# Patient Record
Sex: Female | Born: 1985 | Race: Black or African American | Hispanic: No | Marital: Married | Smoking: Never smoker
Health system: Southern US, Community
[De-identification: ages and names within clinical notes are randomized; demographics above are authoritative.]

## PROBLEM LIST (undated history)

## (undated) DIAGNOSIS — J45909 Unspecified asthma, uncomplicated: Secondary | ICD-10-CM

## (undated) DIAGNOSIS — C539 Malignant neoplasm of cervix uteri, unspecified: Secondary | ICD-10-CM

## (undated) DIAGNOSIS — G43909 Migraine, unspecified, not intractable, without status migrainosus: Secondary | ICD-10-CM

## (undated) DIAGNOSIS — K589 Irritable bowel syndrome without diarrhea: Secondary | ICD-10-CM

## (undated) HISTORY — PX: OTHER SURGICAL HISTORY: SHX169

## (undated) HISTORY — PX: BREAST LUMPECTOMY: SHX2

## (undated) HISTORY — PX: TUBAL LIGATION: SHX77

## (undated) HISTORY — PX: ABDOMINAL HYSTERECTOMY: SHX81

## (undated) HISTORY — PX: CHOLECYSTECTOMY: SHX55

## (undated) HISTORY — DX: Irritable bowel syndrome, unspecified: K58.9

## (undated) HISTORY — PX: KNEE SURGERY: SHX244

## (undated) HISTORY — PX: ROTATOR CUFF REPAIR: SHX139

---

## 2012-09-21 ENCOUNTER — Emergency Department (HOSPITAL_COMMUNITY)
Admission: EM | Admit: 2012-09-21 | Discharge: 2012-09-22 | Disposition: A | Discharge status: Home / Self Care | Payer: Commercial Managed Care - PPO | Attending: Emergency Medicine | Admitting: Emergency Medicine

## 2012-09-21 ENCOUNTER — Emergency Department (HOSPITAL_COMMUNITY): Payer: Commercial Managed Care - PPO

## 2012-09-21 ENCOUNTER — Encounter (HOSPITAL_COMMUNITY): Payer: Self-pay | Admitting: Emergency Medicine

## 2012-09-21 DIAGNOSIS — S139XXA Sprain of joints and ligaments of unspecified parts of neck, initial encounter: Secondary | ICD-10-CM | POA: Insufficient documentation

## 2012-09-21 DIAGNOSIS — S39012A Strain of muscle, fascia and tendon of lower back, initial encounter: Secondary | ICD-10-CM

## 2012-09-21 DIAGNOSIS — Z23 Encounter for immunization: Secondary | ICD-10-CM | POA: Insufficient documentation

## 2012-09-21 DIAGNOSIS — Y9389 Activity, other specified: Secondary | ICD-10-CM | POA: Insufficient documentation

## 2012-09-21 DIAGNOSIS — S161XXA Strain of muscle, fascia and tendon at neck level, initial encounter: Secondary | ICD-10-CM

## 2012-09-21 DIAGNOSIS — S335XXA Sprain of ligaments of lumbar spine, initial encounter: Secondary | ICD-10-CM | POA: Insufficient documentation

## 2012-09-21 DIAGNOSIS — E119 Type 2 diabetes mellitus without complications: Secondary | ICD-10-CM | POA: Insufficient documentation

## 2012-09-21 DIAGNOSIS — J45909 Unspecified asthma, uncomplicated: Secondary | ICD-10-CM | POA: Insufficient documentation

## 2012-09-21 DIAGNOSIS — G43909 Migraine, unspecified, not intractable, without status migrainosus: Secondary | ICD-10-CM | POA: Insufficient documentation

## 2012-09-21 DIAGNOSIS — Z79899 Other long term (current) drug therapy: Secondary | ICD-10-CM | POA: Insufficient documentation

## 2012-09-21 HISTORY — DX: Unspecified asthma, uncomplicated: J45.909

## 2012-09-21 LAB — GLUCOSE, CAPILLARY: Glucose-Capillary: 81 mg/dL (ref 70–99)

## 2012-09-21 MED ORDER — METHOCARBAMOL 750 MG PO TABS: 750.0000 mg | ORAL_TABLET | Freq: Four times a day (QID) | ORAL | Status: DC | PRN

## 2012-09-21 MED ORDER — HYDROCODONE-ACETAMINOPHEN 5-325 MG PO TABS
2.0000 | ORAL_TABLET | Freq: Once | ORAL | Status: AC
  Administered 2012-09-21: 2 via ORAL
  Filled 2012-09-21: qty 2

## 2012-09-21 MED ORDER — DIAZEPAM 5 MG PO TABS
10.0000 mg | ORAL_TABLET | Freq: Once | ORAL | Status: AC
  Administered 2012-09-21: 10 mg via ORAL
  Filled 2012-09-21: qty 2

## 2012-09-21 MED ORDER — HYDROCODONE-ACETAMINOPHEN 5-325 MG PO TABS: 1.0000 | ORAL_TABLET | Freq: Four times a day (QID) | ORAL | Status: DC | PRN

## 2012-09-21 MED ORDER — NAPROXEN 500 MG PO TABS: 500.0000 mg | ORAL_TABLET | Freq: Two times a day (BID) | ORAL | Status: DC | PRN

## 2012-09-21 MED ORDER — TETANUS-DIPHTH-ACELL PERTUSSIS 5-2.5-18.5 LF-MCG/0.5 IM SUSP
0.5000 mL | Freq: Once | INTRAMUSCULAR | Status: AC
  Administered 2012-09-21: 0.5 mL via INTRAMUSCULAR
  Filled 2012-09-21: qty 0.5
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
## 2012-09-21 NOTE — ED Notes (Signed)
CBG 81.  

## 2012-09-21 NOTE — ED Provider Notes (Signed)
Medical screening examination/treatment/procedure(s) were performed by non-physician practitioner and as supervising physician I was immediately available for consultation/collaboration.   Charles B. Bernette Mayers, MD 09/21/12 2141

## 2013-01-30 ENCOUNTER — Ambulatory Visit: Payer: Commercial Managed Care - PPO | Attending: Sports Medicine | Admitting: Physical Therapy

## 2013-01-30 DIAGNOSIS — M25519 Pain in unspecified shoulder: Secondary | ICD-10-CM | POA: Insufficient documentation

## 2013-01-30 DIAGNOSIS — M25619 Stiffness of unspecified shoulder, not elsewhere classified: Secondary | ICD-10-CM | POA: Insufficient documentation

## 2013-01-30 DIAGNOSIS — IMO0001 Reserved for inherently not codable concepts without codable children: Secondary | ICD-10-CM | POA: Insufficient documentation

## 2013-02-04 ENCOUNTER — Ambulatory Visit: Payer: Commercial Managed Care - PPO | Admitting: Physical Therapy

## 2013-02-05 ENCOUNTER — Ambulatory Visit: Payer: Commercial Managed Care - PPO | Admitting: Physical Therapy

## 2013-02-06 ENCOUNTER — Ambulatory Visit: Payer: Commercial Managed Care - PPO | Admitting: Physical Therapy

## 2013-02-07 ENCOUNTER — Ambulatory Visit: Payer: Commercial Managed Care - PPO | Attending: Sports Medicine | Admitting: Physical Therapy

## 2013-02-07 DIAGNOSIS — IMO0001 Reserved for inherently not codable concepts without codable children: Secondary | ICD-10-CM | POA: Insufficient documentation

## 2013-02-07 DIAGNOSIS — M25619 Stiffness of unspecified shoulder, not elsewhere classified: Secondary | ICD-10-CM | POA: Insufficient documentation

## 2013-02-07 DIAGNOSIS — M25519 Pain in unspecified shoulder: Secondary | ICD-10-CM | POA: Insufficient documentation

## 2013-02-10 ENCOUNTER — Encounter (HOSPITAL_COMMUNITY): Payer: Self-pay | Admitting: Nurse Practitioner

## 2013-02-10 ENCOUNTER — Emergency Department (HOSPITAL_COMMUNITY): Payer: Commercial Managed Care - PPO

## 2013-02-10 ENCOUNTER — Emergency Department (HOSPITAL_COMMUNITY)
Admission: EM | Admit: 2013-02-10 | Discharge: 2013-02-10 | Disposition: A | Discharge status: Home / Self Care | Payer: Commercial Managed Care - PPO | Attending: Emergency Medicine | Admitting: Emergency Medicine

## 2013-02-10 DIAGNOSIS — Y929 Unspecified place or not applicable: Secondary | ICD-10-CM | POA: Insufficient documentation

## 2013-02-10 DIAGNOSIS — W230XXA Caught, crushed, jammed, or pinched between moving objects, initial encounter: Secondary | ICD-10-CM | POA: Insufficient documentation

## 2013-02-10 DIAGNOSIS — Z79899 Other long term (current) drug therapy: Secondary | ICD-10-CM | POA: Insufficient documentation

## 2013-02-10 DIAGNOSIS — J45909 Unspecified asthma, uncomplicated: Secondary | ICD-10-CM | POA: Insufficient documentation

## 2013-02-10 DIAGNOSIS — Y939 Activity, unspecified: Secondary | ICD-10-CM | POA: Insufficient documentation

## 2013-02-10 DIAGNOSIS — S92919A Unspecified fracture of unspecified toe(s), initial encounter for closed fracture: Secondary | ICD-10-CM | POA: Insufficient documentation

## 2013-02-10 DIAGNOSIS — S92911A Unspecified fracture of right toe(s), initial encounter for closed fracture: Secondary | ICD-10-CM

## 2013-02-10 DIAGNOSIS — E119 Type 2 diabetes mellitus without complications: Secondary | ICD-10-CM | POA: Insufficient documentation

## 2013-02-10 MED ORDER — HYDROCODONE-ACETAMINOPHEN 5-325 MG PO TABS
1.0000 | ORAL_TABLET | Freq: Once | ORAL | Status: AC
  Administered 2013-02-10: 1 via ORAL
  Filled 2013-02-10: qty 1

## 2013-02-10 MED ORDER — HYDROCODONE-ACETAMINOPHEN 5-325 MG PO TABS: ORAL_TABLET | ORAL | Status: DC

## 2013-02-10 NOTE — ED Provider Notes (Signed)
Medical screening examination/treatment/procedure(s) were performed by non-physician practitioner and as supervising physician I was immediately available for consultation/collaboration.    Shann Merrick R Payne Garske, MD 02/10/13 1553 

## 2013-02-10 NOTE — ED Notes (Signed)
Patient transported to X-ray 

## 2013-02-10 NOTE — Progress Notes (Signed)
Orthopedic Tech Progress Note Patient Details:  Alyssa Owen 08/05/1986 [id number]  Ortho Devices Type of Ortho Device: Buddy tape;Crutches;Postop shoe/boot Ortho Device/Splint Location: RLE Ortho Device/Splint Interventions: Ordered;Application;Adjustment   [name] 02/10/2013, 3:17 PM

## 2013-02-10 NOTE — ED Provider Notes (Signed)
History     CSN: [id number]  Arrival date & time 02/10/13  1347   First MD Initiated Contact with Patient 02/10/13 1410      Chief Complaint  Patient presents with  . Foot Pain    (Consider location/radiation/quality/duration/timing/severity/associated sxs/prior treatment) HPI  Alyssa Owen is a 27 y.o. female complaining of pain to right fifth digit after she jammed it on her husband shoe yesterday. Pain is rated at 8 out of 10, and is exacerbated by weightbearing and palpation. She is taken Motrin with little relief. Pain woke her from sleep at night. No prior injury to affected digit.  Past Medical History  Diagnosis Date  . Diabetes mellitus without complication   . Asthma     Past Surgical History  Procedure Laterality Date  . Cholecystectomy    . Rotator cuff repair      History reviewed. No pertinent family history.  History  Substance Use Topics  . Smoking status: Never Smoker   . Smokeless tobacco: Not on file  . Alcohol Use: No    OB History   Grav Para Term Preterm Abortions TAB SAB Ect Mult Living                  Review of Systems  Constitutional: Negative for fever.  Respiratory: Negative for shortness of breath.   Cardiovascular: Negative for chest pain.  Gastrointestinal: Negative for nausea, vomiting, abdominal pain and diarrhea.  All other systems reviewed and are negative.    Allergies  Codeine and Cephalosporins  Home Medications   Current Outpatient Rx  Name  Route  Sig  Dispense  Refill  . albuterol (PROVENTIL HFA;VENTOLIN HFA) 108 (90 BASE) MCG/ACT inhaler   Inhalation   Inhale 2 puffs into the lungs every 6 (six) hours as needed for wheezing.         Marland Kitchen amitriptyline (ELAVIL) 50 MG tablet   Oral   Take 50 mg by mouth at bedtime.         . Ergocalciferol (VITAMIN D2) 2000 UNITS TABS   Oral   Take 1 tablet by mouth daily.         . fluticasone-salmeterol (ADVAIR HFA) 115-21 MCG/ACT inhaler   Inhalation   Inhale 2  puffs into the lungs 2 (two) times daily.         Marland Kitchen ibuprofen (ADVIL,MOTRIN) 200 MG tablet   Oral   Take 800 mg by mouth every 6 (six) hours as needed for pain.         Marland Kitchen topiramate (TOPAMAX) 50 MG tablet   Oral   Take 50 mg by mouth daily.           BP 123/67  Pulse 74  Temp(Src) 98.3 F (36.8 C)  Resp 18  SpO2 100%  Physical Exam  Nursing note and vitals reviewed. Constitutional: She is oriented to person, place, and time. She appears well-developed and well-nourished. No distress.  HENT:  Head: Normocephalic.  Mouth/Throat: Oropharynx is clear and moist.  Eyes: Conjunctivae and EOM are normal.  Cardiovascular: Normal rate.   Pulmonary/Chest: Effort normal. No stridor.  Musculoskeletal: Normal range of motion.  Right 5th digit swelling and tenderness to palpation, neurovascularly intact reduced range of motion to  Neurological: She is alert and oriented to person, place, and time.  Psychiatric: She has a normal mood and affect.    ED Course  Procedures (including critical care time)  Labs Reviewed - No data to display Dg Toe 5th Right  02/10/2013  *RADIOLOGY REPORT*  Clinical Data: Fifth toe injury, pain  RIGHT FIFTH TOE  Comparison: None.  Findings: Nondisplaced acute fracture of the right fifth toe proximal phalanx.  Mild soft tissue swelling.  No associated subluxation or dislocation.  No radiopaque foreign body.  IMPRESSION: Nondisplaced fracture right fifth toe proximal phalanx.   Original Report Authenticated By: Judie Petit. Shick, M.D.      1. Phalanx fracture, foot, right, closed, initial encounter       MDM   Alyssa Owen is a 27 y.o. female nondisplaced right phalanx fracture. Neurovascularly intact Buddy tape, postop boot, crutches and podiatry referral.   Filed Vitals:   02/10/13 1356  BP: 123/67  Pulse: 74  Temp: 98.3 F (36.8 C)  Resp: 18  SpO2: 100%     VSS and patient is appropriate for, and amenable to, discharge at this time. Pt  verbalized understanding and agrees with care plan. Outpatient follow-up and return precautions given.    New Prescriptions   HYDROCODONE-ACETAMINOPHEN (NORCO/VICODIN) 5-325 MG PER TABLET    Take 1-2 tablets by mouth every 6 hours as needed for pain.            Wynetta Emery, PA-C 02/10/13 1507

## 2013-02-10 NOTE — ED Notes (Signed)
Pt jammed her R pinky toe yesterday. Pain since

## 2013-02-12 ENCOUNTER — Ambulatory Visit: Payer: Commercial Managed Care - PPO | Admitting: Physical Therapy

## 2013-02-15 ENCOUNTER — Ambulatory Visit: Payer: Commercial Managed Care - PPO | Admitting: Physical Therapy

## 2013-02-19 ENCOUNTER — Ambulatory Visit: Payer: Commercial Managed Care - PPO | Admitting: Physical Therapy

## 2013-02-21 ENCOUNTER — Ambulatory Visit: Payer: Commercial Managed Care - PPO | Admitting: Physical Therapy

## 2013-02-26 ENCOUNTER — Ambulatory Visit: Payer: Commercial Managed Care - PPO | Admitting: Physical Therapy

## 2013-02-28 ENCOUNTER — Ambulatory Visit: Payer: Commercial Managed Care - PPO | Admitting: Physical Therapy

## 2013-06-28 ENCOUNTER — Encounter (HOSPITAL_COMMUNITY): Payer: Self-pay

## 2013-06-28 ENCOUNTER — Emergency Department (HOSPITAL_COMMUNITY)
Admission: EM | Admit: 2013-06-28 | Discharge: 2013-06-28 | Discharge status: Against Medical Advice | Payer: Commercial Managed Care - PPO | Attending: Emergency Medicine | Admitting: Emergency Medicine

## 2013-06-28 DIAGNOSIS — R51 Headache: Secondary | ICD-10-CM | POA: Insufficient documentation

## 2013-06-28 DIAGNOSIS — J45909 Unspecified asthma, uncomplicated: Secondary | ICD-10-CM | POA: Insufficient documentation

## 2013-06-28 DIAGNOSIS — R509 Fever, unspecified: Secondary | ICD-10-CM | POA: Insufficient documentation

## 2013-06-28 DIAGNOSIS — R52 Pain, unspecified: Secondary | ICD-10-CM | POA: Insufficient documentation

## 2013-06-28 HISTORY — DX: Migraine, unspecified, not intractable, without status migrainosus: G43.909

## 2013-06-28 LAB — COMPREHENSIVE METABOLIC PANEL
ALT: 12 U/L (ref 0–35)
AST: 20 U/L (ref 0–37)
Albumin: 4 g/dL (ref 3.5–5.2)
Alkaline Phosphatase: 63 U/L (ref 39–117)
CO2: 28 mEq/L (ref 19–32)
Chloride: 105 mEq/L (ref 96–112)
Creatinine, Ser: 0.7 mg/dL (ref 0.50–1.10)
GFR calc non Af Amer: >90 mL/min (ref >90)
Potassium: 3.8 mEq/L (ref 3.5–5.1)
Sodium: 141 mEq/L (ref 135–145)
Total Bilirubin: 0.5 mg/dL (ref 0.3–1.2)

## 2013-06-28 LAB — CBC WITH DIFFERENTIAL/PLATELET
Basophils Absolute: 0 10*3/uL (ref 0.0–0.1)
Basophils Relative: 0 % (ref 0–1)
HCT: 34.3 % — ABNORMAL LOW (ref 36.0–46.0)
Lymphocytes Relative: 39 % (ref 12–46)
MCHC: 36.7 g/dL — ABNORMAL HIGH (ref 30.0–36.0)
Monocytes Absolute: 0.5 10*3/uL (ref 0.1–1.0)
Neutro Abs: 4.6 10*3/uL (ref 1.7–7.7)
Neutrophils Relative %: 54 % (ref 43–77)
RDW: 13.4 % (ref 11.5–15.5)
WBC: 8.6 10*3/uL (ref 4.0–10.5)

## 2013-06-28 NOTE — ED Notes (Addendum)
Pt c/o headache and sensitivity to light x3 days, neck stiffness, unable to fully bend her chin to her chest, fever starting today, and all over body aches x1-2 weeks, increase pain in her joints. [place] bites. Pt report staking Ibuprofen 800 mg at 1800 this pm.

## 2013-06-28 NOTE — ED Notes (Addendum)
Pt told registration that she was leaving.  Stated that she did not want to get any sicker due to germs from other patients and that she would go to [facility] in the morning.  RN was with another pt and pt did not speak with RN

## 2013-10-25 ENCOUNTER — Ambulatory Visit
Admission: RE | Admit: 2013-10-25 | Discharge: 2013-10-25 | Disposition: A | Discharge status: Home / Self Care | Payer: No Typology Code available for payment source | Source: Ambulatory Visit | Attending: Allergy and Immunology | Admitting: Allergy and Immunology

## 2013-10-25 ENCOUNTER — Other Ambulatory Visit: Payer: Self-pay | Admitting: Allergy and Immunology

## 2013-10-25 DIAGNOSIS — R7611 Nonspecific reaction to tuberculin skin test without active tuberculosis: Secondary | ICD-10-CM

## 2013-10-25 DIAGNOSIS — R05 Cough: Secondary | ICD-10-CM

## 2013-10-25 DIAGNOSIS — R059 Cough, unspecified: Secondary | ICD-10-CM

## 2013-10-29 ENCOUNTER — Emergency Department (HOSPITAL_COMMUNITY)
Admission: EM | Admit: 2013-10-29 | Discharge: 2013-10-29 | Disposition: A | Discharge status: Home / Self Care | Payer: BC Managed Care – PPO | Attending: Emergency Medicine | Admitting: Emergency Medicine

## 2013-10-29 ENCOUNTER — Encounter (HOSPITAL_COMMUNITY): Payer: Self-pay | Admitting: Emergency Medicine

## 2013-10-29 ENCOUNTER — Emergency Department (HOSPITAL_COMMUNITY): Payer: BC Managed Care – PPO

## 2013-10-29 DIAGNOSIS — W010XXA Fall on same level from slipping, tripping and stumbling without subsequent striking against object, initial encounter: Secondary | ICD-10-CM | POA: Insufficient documentation

## 2013-10-29 DIAGNOSIS — J45909 Unspecified asthma, uncomplicated: Secondary | ICD-10-CM | POA: Insufficient documentation

## 2013-10-29 DIAGNOSIS — IMO0002 Reserved for concepts with insufficient information to code with codable children: Secondary | ICD-10-CM | POA: Insufficient documentation

## 2013-10-29 DIAGNOSIS — W19XXXA Unspecified fall, initial encounter: Secondary | ICD-10-CM

## 2013-10-29 DIAGNOSIS — S61411A Laceration without foreign body of right hand, initial encounter: Secondary | ICD-10-CM

## 2013-10-29 DIAGNOSIS — Y939 Activity, unspecified: Secondary | ICD-10-CM | POA: Insufficient documentation

## 2013-10-29 DIAGNOSIS — Y929 Unspecified place or not applicable: Secondary | ICD-10-CM | POA: Insufficient documentation

## 2013-10-29 DIAGNOSIS — G43909 Migraine, unspecified, not intractable, without status migrainosus: Secondary | ICD-10-CM | POA: Insufficient documentation

## 2013-10-29 DIAGNOSIS — S61409A Unspecified open wound of unspecified hand, initial encounter: Secondary | ICD-10-CM | POA: Insufficient documentation

## 2013-10-29 DIAGNOSIS — W268XXA Contact with other sharp object(s), not elsewhere classified, initial encounter: Secondary | ICD-10-CM | POA: Insufficient documentation

## 2013-10-29 MED ORDER — OXYCODONE-ACETAMINOPHEN 5-325 MG PO TABS
1.0000 | ORAL_TABLET | Freq: Once | ORAL | Status: AC
  Administered 2013-10-29: 1 via ORAL
  Filled 2013-10-29: qty 1

## 2013-10-29 MED ORDER — TRAMADOL HCL 50 MG PO TABS
50.0000 mg | ORAL_TABLET | Freq: Once | ORAL | Status: AC
  Administered 2013-10-29: 50 mg via ORAL
  Filled 2013-10-29: qty 1

## 2013-10-29 NOTE — ED Notes (Signed)
Pt states she tripped and fell over her purse and her right hand went through her glass door.

## 2013-10-29 NOTE — Discharge Instructions (Signed)
Take ibuprofen as needed for pain. Refer to attached documents for more information. Return to the ED or your PCP for suture removal in 10 days.

## 2013-10-29 NOTE — ED Notes (Signed)
Kaitlyn, PA at the bedside.  

## 2013-10-29 NOTE — ED Provider Notes (Signed)
CSN: [id number]     Arrival date & time 10/29/13  0807 History   First MD Initiated Contact with Patient 10/29/13 [phone]     Chief Complaint  Patient presents with  . Extremity Laceration   (Consider location/radiation/quality/duration/timing/severity/associated sxs/prior Treatment) HPI Comments: Patient is a 28 year old female who presents with right hand pain that started this morning with her right hand went through a glass door. Patient reports she tripped over her purse and fell. Patient reports deep lacerations to her hand and copious bleeding. Patient denies any other injury. Patient is up to date on tetanus shot. Patient bandaged the wound. No other associated symptoms. No aggravating/alleviating factors.    Past Medical History  Diagnosis Date  . Asthma   . Migraine    Past Surgical History  Procedure Laterality Date  . Cholecystectomy    . Rotator cuff repair    . Uterine ablation    . Tubal ligation     No family history on file. History  Substance Use Topics  . Smoking status: Never Smoker   . Smokeless tobacco: Not on file  . Alcohol Use: No   OB History   Grav Para Term Preterm Abortions TAB SAB Ect Mult Living                 Review of Systems  Constitutional: Negative for fever, chills and fatigue.  HENT: Negative for trouble swallowing.   Eyes: Negative for visual disturbance.  Respiratory: Negative for shortness of breath.   Cardiovascular: Negative for chest pain and palpitations.  Gastrointestinal: Negative for nausea, vomiting, abdominal pain and diarrhea.  Genitourinary: Negative for dysuria and difficulty urinating.  Musculoskeletal: Negative for arthralgias and neck pain.  Skin: Positive for wound. Negative for color change.  Neurological: Negative for dizziness and weakness.  Psychiatric/Behavioral: Negative for dysphoric mood.    Allergies  Codeine; Oxycodone; and Cephalosporins  Home Medications   Current Outpatient Rx  Name  Route  Sig   Dispense  Refill  . ibuprofen (ADVIL,MOTRIN) 200 MG tablet   Oral   Take 800 mg by mouth every 6 (six) hours as needed for pain.         . mometasone-formoterol (DULERA) 200-5 MCG/ACT AERO   Inhalation   Inhale 2 puffs into the lungs 2 (two) times daily.         . rizatriptan (MAXALT) 5 MG tablet   Oral   Take 5 mg by mouth daily as needed for migraine (for migraines). May repeat in 2 hours if needed          BP 121/85  Pulse 97  Temp(Src) 98.3 F (36.8 C) (Oral)  Resp 20  Ht 5\' 9"  (1.753 m)  Wt 254 lb (115.214 kg)  BMI 37.49 kg/m2  SpO2 99% Physical Exam  Nursing note and vitals reviewed. Constitutional: She is oriented to person, place, and time. She appears well-developed and well-nourished. No distress.  HENT:  Head: Normocephalic and atraumatic.  Eyes: Conjunctivae are normal.  Neck: Normal range of motion.  Cardiovascular: Normal rate and regular rhythm.  Exam reveals no gallop and no friction rub.   No murmur heard. Pulmonary/Chest: Effort normal and breath sounds normal. She has no wheezes. She has no rales. She exhibits no tenderness.  Musculoskeletal: Normal range of motion.  Neurological: She is alert and oriented to person, place, and time. Coordination normal.  Speech is goal-oriented. Moves limbs without ataxia.   Skin: Skin is warm and dry.  Right hand: 2 separate 3 cm lacerations of dorsal hand that are bleeding currently. Scattered small puncture wounds over right hand. 2 separate 2 cm superficial lacerations of volar wrist.  Psychiatric: She has a normal mood and affect. Her behavior is normal.    ED Course  Procedures (including critical care time)  LACERATION REPAIR Performed by: Alpha GulaJosh Storm   Authorized by: Emilia BeckKaitlyn Tanai Bouler Consent: Verbal consent obtained. Risks and benefits: risks, benefits and alternatives were discussed Consent given by: patient Patient identity confirmed: provided demographic data Prepped and Draped in normal  sterile fashion Wound explored  Laceration Location: right dorsal hand  Laceration Length: 2 separate 3 cm lacerations  No Foreign Bodies seen or palpated  Anesthesia: local infiltration  Local anesthetic: lidocaine 2% with epinephrine  Anesthetic total: 5 ml  Irrigation method: syringe Amount of cleaning: standard  Skin closure: 4-0 prolene  Number of sutures: 9  Technique: simple  Patient tolerance: Patient tolerated the procedure well with no immediate complications.   Labs Review Labs Reviewed - No data to display Imaging Review Dg Hand Complete Right  10/29/2013   CLINICAL DATA:  Extremity laceration.  Fell through glass door.  EXAM: RIGHT HAND - COMPLETE 3+ VIEW  COMPARISON:  None.  FINDINGS: Soft tissue laceration is visible dorsal to the fifth metacarpal. There is overlying bandage material. No acute fracture, dislocation, focal bony abnormality, or unexpected radiopaque foreign body is identified.  IMPRESSION: Soft tissue laceration the hand. No acute bony abnormality or unexpected radiopaque foreign body identified.   Electronically Signed   By: Britta MccreedySusan  Turner M.D.   On: 10/29/2013 08:46    EKG Interpretation   None       MDM   1. Fall   2. Laceration of right hand     9:46 AM Hand xray unremarkable for acute changes. Patient will have laceration thoroughly cleaned and repaired. Patient is up to date on tetanus. Vitals stable and patient afebrile. Patient denies any other injury.   10:20 AM Wound repaired without complication. Patient will have ibuprofen for pain at home. Patient instructed to return here or to her PCP in 10 days for suture removal. No neurovascular compromise noted.    Emilia BeckKaitlyn Ramiah Helfrich, PA-C 10/29/13 1021

## 2013-10-30 NOTE — ED Provider Notes (Signed)
Medical screening examination/treatment/procedure(s) were performed by non-physician practitioner and as supervising physician I was immediately available for consultation/collaboration.  EKG Interpretation   None         Errin Chewning E Chandlar Guice, MD 10/30/13 0923 

## 2014-03-09 ENCOUNTER — Emergency Department (HOSPITAL_COMMUNITY)
Admission: EM | Admit: 2014-03-09 | Discharge: 2014-03-09 | Disposition: A | Discharge status: Home / Self Care | Payer: No Typology Code available for payment source | Source: Home / Self Care | Attending: Family Medicine | Admitting: Family Medicine

## 2014-03-09 ENCOUNTER — Encounter (HOSPITAL_COMMUNITY): Payer: Self-pay | Admitting: Emergency Medicine

## 2014-03-09 DIAGNOSIS — G43901 Migraine, unspecified, not intractable, with status migrainosus: Secondary | ICD-10-CM

## 2014-03-09 MED ORDER — KETOROLAC TROMETHAMINE 60 MG/2ML IM SOLN
60.0000 mg | Freq: Once | INTRAMUSCULAR | Status: AC
  Administered 2014-03-09: 60 mg via INTRAMUSCULAR

## 2014-03-09 MED ORDER — DIPHENHYDRAMINE HCL 25 MG PO CAPS
25.0000 mg | ORAL_CAPSULE | Freq: Once | ORAL | Status: AC
  Administered 2014-03-09: 25 mg via ORAL

## 2014-03-09 MED ORDER — METOCLOPRAMIDE HCL 5 MG/ML IJ SOLN
5.0000 mg | Freq: Once | INTRAMUSCULAR | Status: AC
  Administered 2014-03-09: 5 mg via INTRAMUSCULAR

## 2014-03-09 MED ORDER — METOCLOPRAMIDE HCL 5 MG/ML IJ SOLN
INTRAMUSCULAR | Status: AC
  Filled 2014-03-09: qty 2

## 2014-03-09 MED ORDER — DICLOFENAC SODIUM 50 MG PO TBEC: 50.0000 mg | DELAYED_RELEASE_TABLET | Freq: Two times a day (BID) | ORAL | Status: DC | PRN

## 2014-03-09 MED ORDER — DEXAMETHASONE SODIUM PHOSPHATE 10 MG/ML IJ SOLN
10.0000 mg | Freq: Once | INTRAMUSCULAR | Status: AC
Start: 2014-03-09 — End: 2014-03-09
  Administered 2014-03-09: 10 mg via INTRAMUSCULAR

## 2014-03-09 MED ORDER — DEXAMETHASONE SODIUM PHOSPHATE 10 MG/ML IJ SOLN
INTRAMUSCULAR | Status: AC
  Filled 2014-03-09: qty 1

## 2014-03-09 MED ORDER — DIPHENHYDRAMINE HCL 25 MG PO CAPS
ORAL_CAPSULE | ORAL | Status: AC
  Filled 2014-03-09: qty 1

## 2014-03-09 MED ORDER — KETOROLAC TROMETHAMINE 60 MG/2ML IM SOLN
INTRAMUSCULAR | Status: AC
  Filled 2014-03-09: qty 2

## 2014-03-09 NOTE — ED Notes (Signed)
C/o migraine for four days  States she has tried Maxalt, ibuprofen, tylenol and goody's as tx

## 2014-03-09 NOTE — Discharge Instructions (Signed)
Thank you for coming in today. Go to the emergency room if your headache becomes excruciating or you have weakness or numbness or uncontrolled vomiting.    Recurrent Migraine Headache A migraine headache is an intense, throbbing pain on one or both sides of your head. Recurrent migraines keep coming back. A migraine can last for 30 minutes to several hours. CAUSES  The exact cause of a migraine headache is not always known. However, a migraine may be caused when nerves in the brain become irritated and release chemicals that cause inflammation. This causes pain. Certain things may also trigger migraines, such as:   Alcohol.  Smoking.  Stress.  Menstruation.  Aged cheeses.  Foods or drinks that contain nitrates, glutamate, aspartame, or tyramine.  Lack of sleep.  Chocolate.  Caffeine.  Hunger.  Physical exertion.  Fatigue.  Medicines used to treat chest pain (nitroglycerine), birth control pills, estrogen, and some blood pressure medicines. SYMPTOMS   Pain on one or both sides of your head.  Pulsating or throbbing pain.  Severe pain that prevents daily activities.  Pain that is aggravated by any physical activity.  Nausea, vomiting, or both.  Dizziness.  Pain with exposure to bright lights, loud noises, or activity.  General sensitivity to bright lights, loud noises, or smells. Before you get a migraine, you may get warning signs that a migraine is coming (aura). An aura may include:  Seeing flashing lights.  Seeing bright spots, halos, or zig-zag lines.  Having tunnel vision or blurred vision.  Having feelings of numbness or tingling.  Having trouble talking.  Having muscle weakness. DIAGNOSIS  A recurrent migraine headache is often diagnosed based on:  Symptoms.  Physical examination.  A CT scan or MRI of your head. These imaging tests cannot diagnose migraines, but can help rule out other causes of headaches.  TREATMENT  Medicines may be  given for pain and nausea. Medicines can also be given to help prevent recurrent migraines. HOME CARE INSTRUCTIONS  Only take over-the-counter or prescription medicines for pain or discomfort as directed by your health care provider. The use of long-term narcotics is not recommended.  Lie down in a dark, quiet room when you have a migraine.  Keep a journal to find out what may trigger your migraine headaches. For example, write down:  What you eat and drink.  How much sleep you get.  Any change to your diet or medicines.  Limit alcohol consumption.  Quit smoking if you smoke.  Get 7 9 hours of sleep, or as recommended by your health care provider.  Limit stress.  Keep lights dim if bright lights bother you and make your migraines worse. SEEK MEDICAL CARE IF:   You do not get relief from the medicines given to you.  You have a recurrence of pain. SEEK IMMEDIATE MEDICAL CARE IF:  Your migraine becomes severe.  You have a fever.  You have a stiff neck.  You have loss of vision.  You have muscular weakness or loss of muscle control.  You start losing your balance or have trouble walking.  You feel faint or pass out.  You have severe symptoms that are different from your first symptoms. MAKE SURE YOU:   Understand these instructions.  Will watch your condition.  Will get help right away if you are not doing well or get worse. Document Released: 06/21/2001 Document Revised: 07/17/2013 Document Reviewed: 06/03/2013 [facility] Patient Information 2014 [place], [place].

## 2014-03-09 NOTE — ED Provider Notes (Addendum)
Alyssa Owen is a 28 y.o. female who presents to Urgent Care today for migraine for 4 days. Patient has right-sided squeezing headache consistent with prior migraines the last 4 days. No fevers or chills nausea vomiting or diarrhea. Symptoms consistent with prior migraine. She's tried Maxalt Tylenol ibuprofen and get better which have not helped. She feels well otherwise. No weakness or numbness loss of function.   Past Medical History  Diagnosis Date  . Asthma   . Migraine    History  Substance Use Topics  . Smoking status: Never Smoker   . Smokeless tobacco: Not on file  . Alcohol Use: No   ROS as above Medications: No current facility-administered medications for this encounter.   Current Outpatient Prescriptions  Medication Sig Dispense Refill  . ibuprofen (ADVIL,MOTRIN) 200 MG tablet Take 800 mg by mouth every 6 (six) hours as needed for pain.      . mometasone-formoterol (DULERA) 200-5 MCG/ACT AERO Inhale 2 puffs into the lungs 2 (two) times daily.      . rizatriptan (MAXALT) 5 MG tablet Take 5 mg by mouth daily as needed for migraine (for migraines). May repeat in 2 hours if needed        Exam:  BP 104/56  Pulse 61  Temp(Src) 98.3 F (36.8 C) (Oral)  Resp 18  SpO2 100% Gen: Well NAD HEENT: EOMI,  MMM PERRLA. Lungs: Normal work of breathing. CTABL Heart: RRR no MRG Abd: NABS, Soft. NT, ND Exts: Brisk capillary refill, warm and well perfused.  Neuro: Alert and oriented cranial nerves intact normal coordination gait reflexes strength and sensation.  Patient was given intramuscular Toradol, dexamethasone, and Reglan. She developed itching. She denies any throat closing or trouble breathing. She was given 25 mg of oral Benadryl. After 20 minutes the itching had resolved. She has no respiratory complaints and her headache is somewhat improved.  No results found for this or any previous visit (from the past 24 hour(s)). No results found.  Assessment and Plan: 28 y.o.  female with migraine. Followup with PCP. Headache until as above. Diclofenac for home use as needed.  Discussed warning signs or symptoms. Please see discharge instructions. Patient expresses understanding.    Rodolph Bong, MD 03/09/14 1237  Addendum to include doses of medications provided  dexamethasone (DECADRON) injection 10 mg Intramuscular  ketorolac (TORADOL) injection 60 mg Intramuscular  metoCLOPramide (REGLAN) injection 5 mg    Rodolph Bong, MD 06/06/14 [phone]

## 2014-03-10 ENCOUNTER — Encounter (HOSPITAL_COMMUNITY): Payer: Self-pay | Admitting: Emergency Medicine

## 2014-03-10 ENCOUNTER — Emergency Department (HOSPITAL_COMMUNITY)
Admission: EM | Admit: 2014-03-10 | Discharge: 2014-03-10 | Disposition: A | Discharge status: Home / Self Care | Payer: No Typology Code available for payment source | Attending: Emergency Medicine | Admitting: Emergency Medicine

## 2014-03-10 DIAGNOSIS — J45909 Unspecified asthma, uncomplicated: Secondary | ICD-10-CM | POA: Insufficient documentation

## 2014-03-10 DIAGNOSIS — R209 Unspecified disturbances of skin sensation: Secondary | ICD-10-CM | POA: Insufficient documentation

## 2014-03-10 DIAGNOSIS — Z79899 Other long term (current) drug therapy: Secondary | ICD-10-CM | POA: Insufficient documentation

## 2014-03-10 DIAGNOSIS — R51 Headache: Secondary | ICD-10-CM

## 2014-03-10 DIAGNOSIS — IMO0002 Reserved for concepts with insufficient information to code with codable children: Secondary | ICD-10-CM | POA: Insufficient documentation

## 2014-03-10 DIAGNOSIS — R002 Palpitations: Secondary | ICD-10-CM | POA: Insufficient documentation

## 2014-03-10 DIAGNOSIS — R42 Dizziness and giddiness: Secondary | ICD-10-CM | POA: Insufficient documentation

## 2014-03-10 DIAGNOSIS — R519 Headache, unspecified: Secondary | ICD-10-CM

## 2014-03-10 DIAGNOSIS — G43909 Migraine, unspecified, not intractable, without status migrainosus: Secondary | ICD-10-CM | POA: Insufficient documentation

## 2014-03-10 DIAGNOSIS — Z3202 Encounter for pregnancy test, result negative: Secondary | ICD-10-CM | POA: Insufficient documentation

## 2014-03-10 DIAGNOSIS — R55 Syncope and collapse: Secondary | ICD-10-CM | POA: Insufficient documentation

## 2014-03-10 DIAGNOSIS — M62838 Other muscle spasm: Secondary | ICD-10-CM | POA: Insufficient documentation

## 2014-03-10 LAB — BASIC METABOLIC PANEL
BUN: 9 mg/dL (ref 6–23)
CHLORIDE: 106 meq/L (ref 96–112)
CO2: 24 mEq/L (ref 19–32)
Calcium: 9.1 mg/dL (ref 8.4–10.5)
Creatinine, Ser: 0.72 mg/dL (ref 0.50–1.10)
GFR calc non Af Amer: >90 mL/min (ref >90)
GLUCOSE: 105 mg/dL — AB (ref 70–99)
POTASSIUM: 3.8 meq/L (ref 3.7–5.3)
Sodium: 140 mEq/L (ref 137–147)

## 2014-03-10 LAB — URINALYSIS, ROUTINE W REFLEX MICROSCOPIC
Bilirubin Urine: NEGATIVE
GLUCOSE, UA: NEGATIVE mg/dL
Hgb urine dipstick: NEGATIVE
Ketones, ur: NEGATIVE mg/dL
Leukocytes, UA: NEGATIVE
Nitrite: NEGATIVE
Protein, ur: NEGATIVE mg/dL
SPECIFIC GRAVITY, URINE: 1.029 (ref 1.005–1.030)
UROBILINOGEN UA: 0.2 mg/dL (ref 0.0–1.0)
pH: 6 (ref 5.0–8.0)

## 2014-03-10 LAB — CBC
HEMATOCRIT: 34.2 % — AB (ref 36.0–46.0)
Hemoglobin: 12.2 g/dL (ref 12.0–15.0)
MCH: 30 pg (ref 26.0–34.0)
MCHC: 35.7 g/dL (ref 30.0–36.0)
MCV: 84.2 fL (ref 78.0–100.0)
Platelets: 212 10*3/uL (ref 150–400)
RBC: 4.06 MIL/uL (ref 3.87–5.11)
RDW: 13 % (ref 11.5–15.5)
WBC: 16.6 10*3/uL — AB (ref 4.0–10.5)

## 2014-03-10 LAB — PREGNANCY, URINE: Preg Test, Ur: NEGATIVE

## 2014-03-10 MED ORDER — METOCLOPRAMIDE HCL 5 MG/ML IJ SOLN
10.0000 mg | Freq: Once | INTRAMUSCULAR | Status: AC
  Administered 2014-03-10: 10 mg via INTRAVENOUS
  Filled 2014-03-10: qty 2

## 2014-03-10 MED ORDER — DIAZEPAM 5 MG PO TABS: 5.0000 mg | ORAL_TABLET | Freq: Two times a day (BID) | ORAL | Status: DC

## 2014-03-10 MED ORDER — DIPHENHYDRAMINE HCL 50 MG/ML IJ SOLN
25.0000 mg | Freq: Once | INTRAMUSCULAR | Status: AC
  Administered 2014-03-10: 25 mg via INTRAVENOUS
  Filled 2014-03-10: qty 1

## 2014-03-10 MED ORDER — KETOROLAC TROMETHAMINE 30 MG/ML IJ SOLN
30.0000 mg | Freq: Once | INTRAMUSCULAR | Status: AC
  Administered 2014-03-10: 30 mg via INTRAVENOUS
  Filled 2014-03-10: qty 1

## 2014-03-10 MED ORDER — LIDOCAINE 5 % EX PTCH: 1.0000 | MEDICATED_PATCH | CUTANEOUS | Status: DC

## 2014-03-10 MED ORDER — MORPHINE SULFATE 4 MG/ML IJ SOLN: 4.0000 mg | Freq: Once | INTRAMUSCULAR | Status: DC

## 2014-03-10 MED ORDER — PROCHLORPERAZINE EDISYLATE 5 MG/ML IJ SOLN
10.0000 mg | Freq: Once | INTRAMUSCULAR | Status: AC
  Administered 2014-03-10: 10 mg via INTRAVENOUS
  Filled 2014-03-10: qty 2
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
## 2014-03-10 NOTE — Discharge Instructions (Signed)
Please follow up with your primary care physician in 1-2 days. If you do not have one please call the [facility] number listed above. Please follow up at your scheduled neurology appointment. Please take pain medication and/or muscle relaxants as prescribed and as needed for pain. Please do not drive on narcotic pain medication or on muscle relaxants. Please read all discharge instructions and return precautions.   Migraine Headache A migraine headache is an intense, throbbing pain on one or both sides of your head. A migraine can last for 30 minutes to several hours. CAUSES  The exact cause of a migraine headache is not always known. However, a migraine may be caused when nerves in the brain become irritated and release chemicals that cause inflammation. This causes pain. Certain things may also trigger migraines, such as:  Alcohol.  Smoking.  Stress.  Menstruation.  Aged cheeses.  Foods or drinks that contain nitrates, glutamate, aspartame, or tyramine.  Lack of sleep.  Chocolate.  Caffeine.  Hunger.  Physical exertion.  Fatigue.  Medicines used to treat chest pain (nitroglycerine), birth control pills, estrogen, and some blood pressure medicines. SIGNS AND SYMPTOMS  Pain on one or both sides of your head.  Pulsating or throbbing pain.  Severe pain that prevents daily activities.  Pain that is aggravated by any physical activity.  Nausea, vomiting, or both.  Dizziness.  Pain with exposure to bright lights, loud noises, or activity.  General sensitivity to bright lights, loud noises, or smells. Before you get a migraine, you may get warning signs that a migraine is coming (aura). An aura may include:  Seeing flashing lights.  Seeing bright spots, halos, or zig-zag lines.  Having tunnel vision or blurred vision.  Having feelings of numbness or tingling.  Having trouble talking.  Having muscle weakness. DIAGNOSIS  A migraine headache  is often diagnosed based on:  Symptoms.  Physical exam.  A CT scan or MRI of your head. These imaging tests cannot diagnose migraines, but they can help rule out other causes of headaches. TREATMENT Medicines may be given for pain and nausea. Medicines can also be given to help prevent recurrent migraines.  HOME CARE INSTRUCTIONS  Only take over-the-counter or prescription medicines for pain or discomfort as directed by your health care provider. The use of long-term narcotics is not recommended.  Lie down in a dark, quiet room when you have a migraine.  Keep a journal to find out what may trigger your migraine headaches. For example, write down:  What you eat and drink.  How much sleep you get.  Any change to your diet or medicines.  Limit alcohol consumption.  Quit smoking if you smoke.  Get 7 9 hours of sleep, or as recommended by your health care provider.  Limit stress.  Keep lights dim if bright lights bother you and make your migraines worse. SEEK IMMEDIATE MEDICAL CARE IF:   Your migraine becomes severe.  You have a fever.  You have a stiff neck.  You have vision loss.  You have muscular weakness or loss of muscle control.  You start losing your balance or have trouble walking.  You feel faint or pass out.  You have severe symptoms that are different from your first symptoms. MAKE SURE YOU:   Understand these instructions.  Will watch your condition.  Will get help right away if you are not doing well or get worse. Document Released: 09/26/2005 Document Revised: 07/17/2013 Document Reviewed: 06/03/2013 ExitCare Patient  Information 2014 [place].   Muscle Cramps and Spasms Muscle cramps and spasms occur when a muscle or muscles tighten and you have no control over this tightening (involuntary muscle contraction). They are a common problem and can develop in any muscle. The most common place is in the calf muscles of the leg. Both muscle  cramps and muscle spasms are involuntary muscle contractions, but they also have differences:   Muscle cramps are sporadic and painful. They may last a few seconds to a quarter of an hour. Muscle cramps are often more forceful and last longer than muscle spasms.  Muscle spasms may or may not be painful. They may also last just a few seconds or much longer. CAUSES  It is uncommon for cramps or spasms to be due to a serious underlying problem. In many cases, the cause of cramps or spasms is unknown. Some common causes are:   Overexertion.   Overuse from repetitive motions (doing the same thing over and over).   Remaining in a certain position for a long period of time.   Improper preparation, form, or technique while performing a sport or activity.   Dehydration.   Injury.   Side effects of some medicines.   Abnormally low levels of the salts and ions in your blood (electrolytes), especially potassium and calcium. This could happen if you are taking water pills (diuretics) or you are pregnant.  Some underlying medical problems can make it more likely to develop cramps or spasms. These include, but are not limited to:   Diabetes.   Parkinson disease.   Hormone disorders, such as thyroid problems.   Alcohol abuse.   Diseases specific to muscles, joints, and bones.   Blood vessel disease where not enough blood is getting to the muscles.  HOME CARE INSTRUCTIONS   Stay well hydrated. Drink enough water and fluids to keep your urine clear or pale yellow.  It may be helpful to massage, stretch, and relax the affected muscle.  For tight or tense muscles, use a warm towel, heating pad, or hot shower water directed to the affected area.  If you are sore or have pain after a cramp or spasm, applying ice to the affected area may relieve discomfort.  Put ice in a plastic bag.  Place a towel between your skin and the bag.  Leave the ice on for 15-20 minutes, 03-04  times a day.  Medicines used to treat a known cause of cramps or spasms may help reduce their frequency or severity. Only take over-the-counter or prescription medicines as directed by your caregiver. SEEK MEDICAL CARE IF:  Your cramps or spasms get more severe, more frequent, or do not improve over time.  MAKE SURE YOU:   Understand these instructions.  Will watch your condition.  Will get help right away if you are not doing well or get worse. Document Released: 03/18/2002 Document Revised: 01/21/2013 Document Reviewed: 09/12/2012 [facility] Patient Information 2014 [place], [place]. Syncope Syncope is a fainting spell. This means the person loses consciousness and drops to the ground. The person is generally unconscious for less than 5 minutes. The person may have some muscle twitches for up to 15 seconds before waking up and returning to normal. Syncope occurs more often in elderly people, but it can happen to anyone. While most causes of syncope are not dangerous, syncope can be a sign of a serious medical problem. It is important to seek medical care.  CAUSES  Syncope is caused by a  sudden decrease in blood flow to the brain. The specific cause is often not determined. Factors that can trigger syncope include:  Taking medicines that lower blood pressure.  Sudden changes in posture, such as standing up suddenly.  Taking more medicine than prescribed.  Standing in one place for too long.  Seizure disorders.  Dehydration and excessive exposure to heat.  Low blood sugar (hypoglycemia).  Straining to have a bowel movement.  Heart disease, irregular heartbeat, or other circulatory problems.  Fear, emotional distress, seeing blood, or severe pain. SYMPTOMS  Right before fainting, you may:  Feel dizzy or lightheaded.  Feel nauseous.  See all white or all black in your field of vision.  Have cold, clammy skin. DIAGNOSIS  Your caregiver will ask about your symptoms, perform  a physical exam, and perform electrocardiography (ECG) to record the electrical activity of your heart. Your caregiver may also perform other heart or blood tests to determine the cause of your syncope. TREATMENT  In most cases, no treatment is needed. Depending on the cause of your syncope, your caregiver may recommend changing or stopping some of your medicines. HOME CARE INSTRUCTIONS  Have someone stay with you until you feel stable.  Do not drive, operate machinery, or play sports until your caregiver says it is okay.  Keep all follow-up appointments as directed by your caregiver.  Lie down right away if you start feeling like you might faint. Breathe deeply and steadily. Wait until all the symptoms have passed.  Drink enough fluids to keep your urine clear or pale yellow.  If you are taking blood pressure or heart medicine, get up slowly, taking several minutes to sit and then stand. This can reduce dizziness. SEEK IMMEDIATE MEDICAL CARE IF:   You have a severe headache.  You have unusual pain in the chest, abdomen, or back.  You are bleeding from the mouth or rectum, or you have black or tarry stool.  You have an irregular or very fast heartbeat.  You have pain with breathing.  You have repeated fainting or seizure-like jerking during an episode.  You faint when sitting or lying down.  You have confusion.  You have difficulty walking.  You have severe weakness.  You have vision problems. If you fainted, call your local emergency services (911 in [place] Do not drive yourself to the hospital.  MAKE SURE YOU:  Understand these instructions.  Will watch your condition.  Will get help right away if you are not doing well or get worse. Document Released: 09/26/2005 Document Revised: 03/27/2012 Document Reviewed: 11/25/2011 [facility] Patient Information 2014 [place], [place].

## 2014-03-12 ENCOUNTER — Ambulatory Visit (INDEPENDENT_AMBULATORY_CARE_PROVIDER_SITE_OTHER): Payer: No Typology Code available for payment source | Admitting: Neurology

## 2014-03-12 ENCOUNTER — Encounter: Payer: Self-pay | Admitting: Neurology

## 2014-03-12 ENCOUNTER — Telehealth: Payer: Self-pay | Admitting: *Deleted

## 2014-03-12 VITALS — BP 118/74 | HR 78 | Ht 69.0 in | Wt 255.0 lb

## 2014-03-12 DIAGNOSIS — G43909 Migraine, unspecified, not intractable, without status migrainosus: Secondary | ICD-10-CM

## 2014-03-12 DIAGNOSIS — J45909 Unspecified asthma, uncomplicated: Secondary | ICD-10-CM | POA: Insufficient documentation

## 2014-03-12 MED ORDER — TOPIRAMATE 100 MG PO TABS: 100.0000 mg | ORAL_TABLET | Freq: Two times a day (BID) | ORAL | Status: DC

## 2014-03-12 MED ORDER — RIZATRIPTAN BENZOATE 10 MG PO TBDP: 10.0000 mg | ORAL_TABLET | ORAL | Status: DC | PRN

## 2014-03-12 MED ORDER — METHYLPREDNISOLONE (PAK) 4 MG PO TABS: ORAL_TABLET | ORAL | Status: DC

## 2014-03-12 MED ORDER — NORTRIPTYLINE HCL 10 MG PO CAPS: ORAL_CAPSULE | ORAL | Status: DC

## 2014-03-12 MED ORDER — VALPROATE SODIUM 500 MG/5ML IV SOLN
1500.0000 mg | INTRAVENOUS | Status: DC
  Administered 2014-03-12: 1500 mg via INTRAVENOUS

## 2014-03-12 MED ORDER — KETOROLAC TROMETHAMINE 30 MG/ML IJ SOLN: 30.0000 mg | Freq: Once | INTRAMUSCULAR | Status: DC

## 2014-03-12 MED ORDER — KETOROLAC TROMETHAMINE 30 MG/ML IJ SOLN
30.0000 mg | Freq: Once | INTRAMUSCULAR | Status: AC
  Administered 2014-03-12: 30 mg via INTRAMUSCULAR

## 2014-03-12 NOTE — Progress Notes (Signed)
Patient here seeing Dr. Terrace Arabia for headache.  Order for Depacon 1000mg  IV may repeat 500mg , Solumedrol 250mg IV , Compazine 10mg  IV, Toradol 30mg  IM.  Patient to treatment room, headache pain level 10.  Patient very difficult stick.  IV started in right AC, good blood return, 24g angiocath.  Stick was painful due to previous IV on Monday at the hospital.  Patient very cold and had chills, covered with 3 blankets, temperature 97.9.  Depacon 1000mg /100cc NaCl started at 0922.  Upon completion no change in pain level.  Depacon 500mg /100cc NaCl started at 0939.  Upon completion slight change in pain level to 8.  Solumedrol 250mg /100cc NaCl started at 0955.  Patient complained of burning and soreness at IV site, site was red, swollen and painful.  Infusion was stopped and IV removed.  When patient stood up to use restroom, pain level went back up to 10. Spoke to doctor and order for Toradol 30mg  IM to be given but no Compazine, also no further IV started.  Doctor came and spoke to patient.  Under aseptic technique Toradol 30mg  IM given in left gluteal.  Patient to check out with spouse in NAD.

## 2014-03-12 NOTE — Telephone Encounter (Signed)
Left message that I would relay to Dr. Terrace Arabia that she had a temperature of 101.2 when she got home.  Also will write an out of work note for today, Thursday and Friday and call her tomorrow when note is ready.

## 2014-03-12 NOTE — Addendum Note (Signed)
Addended by: [name] on: 03/12/2014 10:32 AM   Modules accepted: Orders

## 2014-03-12 NOTE — Progress Notes (Addendum)
PATIENT: Alyssa ShellingJammie Owen DOB: 1985/12/16  HISTORICAL  Alyssa BoxJammie Manson PasseyBrown is a 28 years old right-handed African American female, referred by her primary care physician Dr. Jola SchmidtEric Beck for evaluation of migraine headaches  She has migraine since middle school, her typical migraine are right retro-orbital area severe pounding headaches, with associated light, noise sensitivity, she has tried amitriptyline 50 mg every night, Topamax 25 mg twice a day, which has worked very well for her, while she taking preventive medications, she only has one to 2 migraine each month, Maxalt has been very helpful, but she suffered a side effect of excessive drowsiness, hung over, decreased sex drive while taking amitriptyline 50 mg every night, there was no significant side effect from Topamax  She has run out her prescription for one year, began to have increased frequency of headaches over the past 6 months, getting worse over the past 3 weeks, her headache now lasting for one week, she presented to urgent care, and a [facility] on emergency room, twice over the past couple weeks, 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, has tried Excedrin Migraine, Maxalt, diclofenac, without helping her headaches, she was given IV Benadryl, Reglan, Compazine, Toradol, with only temporary relief of her symptoms,  She came in today with a one-week history of right retro-orbital area pounding headaches, worsening by movement, with associated light sensitivity, nausea, blurry vision, right arm numbness, but no weakness,   REVIEW OF SYSTEMS: Full 14 system review of systems performed and notable only for fatigue, chest pain, palpitation, blurry vision, eye pain, easy bruising, flushing, pain, achy muscles allergy, headaches, numbness, weakness, dizziness, passing out, sleepiness, decreased energy   ALLERGIES: Allergies  Allergen Reactions  . Codeine     Scratchy throat, cough, hives  . Oxycodone Other (See  Comments)    Hot flashes   . Cephalosporins Rash and Cough    HOME MEDICATIONS: Current Outpatient Prescriptions on File Prior to Visit  Medication Sig Dispense Refill  . albuterol (PROVENTIL HFA;VENTOLIN HFA) 108 (90 BASE) MCG/ACT inhaler Inhale 2 puffs into the lungs every 6 (six) hours as needed for wheezing or shortness of breath.      . diazepam (VALIUM) 5 MG tablet Take 1 tablet (5 mg total) by mouth 2 (two) times daily.  10 tablet  0  . diclofenac (VOLTAREN) 50 MG EC tablet Take 50 mg by mouth 2 (two) times daily as needed for moderate pain.      Marland Kitchen. ibuprofen (ADVIL,MOTRIN) 200 MG tablet Take 600-800 mg by mouth every 6 (six) hours as needed for headache or moderate pain.       Marland Kitchen. lidocaine (LIDODERM) 5 % Place 1 patch onto the skin daily. Remove & Discard patch within 12 hours or as directed by MD  10 patch  0  . mometasone-formoterol (DULERA) 200-5 MCG/ACT AERO Inhale 2 puffs into the lungs 2 (two) times daily.      . rizatriptan (MAXALT) 5 MG tablet Take 5 mg by mouth daily as needed for migraine.        No current facility-administered medications on file prior to visit.    PAST MEDICAL HISTORY: Past Medical History  Diagnosis Date  . Asthma   . Migraine     PAST SURGICAL HISTORY: Past Surgical History  Procedure Laterality Date  . Cholecystectomy    . Rotator cuff repair    . Uterine ablation    . Tubal ligation      FAMILY  HISTORY: Mother, sister, daughter has migraine  SOCIAL HISTORY:  History   Social History  . Marital Status: Married    Spouse Name: Michelle Piper    Number of Children: 3  . Years of Education: college   Occupational History  . CMA    Social History Main Topics  . Smoking status: Never Smoker   . Smokeless tobacco: Never Used  . Alcohol Use: Yes  . Drug Use: No  . Sexual Activity: No   Other Topics Concern  . Not on file   Social History Narrative   Patient lives at home with her husband and children. Patient works full time  Armed forces operational officer.    Education some college   Right handed.   Caffeine sometimes.    PHYSICAL EXAM   Filed Vitals:   03/12/14 0803  BP: 118/74  Pulse: 78  Height: 5\' 9"  (1.753 m)  Weight: 255 lb (115.667 kg)    Not recorded    Body mass index is 37.64 kg/(m^2).   Generalized: In no acute distress  Neck: Supple, no carotid bruits   Cardiac: Regular rate rhythm  Pulmonary: Clear to auscultation bilaterally  Musculoskeletal: No deformity  Neurological examination  Mentation: Alert oriented to time, place, history taking, and causual conversation  Cranial nerve II-XII: Pupils were equal round reactive to light. Extraocular movements were full.  Visual field were full on confrontational test. Bilateral fundi were sharp.  Facial sensation and strength were normal. Hearing was intact to finger rubbing bilaterally. Uvula tongue midline.  Head turning and shoulder shrug and were normal and symmetric.Tongue protrusion into cheek strength was normal.  Motor: Normal tone, bulk and strength.  Sensory: Intact to fine touch, pinprick, preserved vibratory sensation, and proprioception at toes.  Coordination: Normal finger to nose, heel-to-shin bilaterally there was no truncal ataxia  Gait: Rising up from seated position without assistance, normal stance, without trunk ataxia, moderate stride, good arm swing, smooth turning, able to perform tiptoe, and heel walking without difficulty.   Romberg signs: Negative  Deep tendon reflexes: Brachioradialis 2/2, biceps 2/2, triceps 2/2, patellar 2/2, Achilles 2/2, plantar responses were flexor bilaterally.   DIAGNOSTIC DATA (LABS, IMAGING, TESTING) - I reviewed patient records, labs, notes, testing and imaging myself where available.  Lab Results  Component Value Date   WBC 16.6* 03/10/2014   HGB 12.2 03/10/2014   HCT 34.2* 03/10/2014   MCV 84.2 03/10/2014   PLT 212 03/10/2014      Component Value Date/Time   NA 140 03/10/2014 1630   K 3.8  03/10/2014 1630   CL 106 03/10/2014 1630   CO2 24 03/10/2014 1630   GLUCOSE 105* 03/10/2014 1630   BUN 9 03/10/2014 1630   CREATININE 0.72 03/10/2014 1630   CALCIUM 9.1 03/10/2014 1630   PROT 7.1 06/28/2013 2043   ALBUMIN 4.0 06/28/2013 2043   AST 20 06/28/2013 2043   ALT 12 06/28/2013 2043   ALKPHOS 63 06/28/2013 2043   BILITOT 0.5 06/28/2013 2043   GFRNONAA >90 03/10/2014 1630   GFRAA >90 03/10/2014 1630   ASSESSMENT AND PLAN  Len Quinones is a 28 y.o. female complains of  Migraine headaches,   1. refill or preventive medications, increase Topamax to 100 mg twice a day, decrease nortriptyline to 10-20 mg every night, 2. Maxalt as needed 3. Return to clinic with Eber Jones in one month   I also giving her IV Depacon1500mg , only with limited help.  Will do MRI brain w/wo contrast  [name], M.D. Ph.D.  [facility] [address], [address] [place], [place] [address] [phone] 

## 2014-03-12 NOTE — Addendum Note (Signed)
Addended by: [name] on: 03/12/2014 10:07 AM   Modules accepted: Orders

## 2014-03-12 NOTE — Patient Instructions (Signed)
Patient to return home with spouse and rest. 

## 2014-03-13 ENCOUNTER — Ambulatory Visit (INDEPENDENT_AMBULATORY_CARE_PROVIDER_SITE_OTHER): Payer: No Typology Code available for payment source

## 2014-03-13 DIAGNOSIS — J45909 Unspecified asthma, uncomplicated: Secondary | ICD-10-CM

## 2014-03-13 DIAGNOSIS — G43909 Migraine, unspecified, not intractable, without status migrainosus: Secondary | ICD-10-CM

## 2014-03-13 MED ORDER — GADOPENTETATE DIMEGLUMINE 469.01 MG/ML IV SOLN: 18.0000 mL | Freq: Once | INTRAVENOUS | Status: AC | PRN

## 2014-03-13 NOTE — Telephone Encounter (Signed)
Spoke to patient and she has no temperature this morning, her headache is getting better.  She will pick up her out of work note today, also has her MRI scheduled today.

## 2014-03-13 NOTE — ED Provider Notes (Signed)
Medical screening examination/treatment/procedure(s) were performed by non-physician practitioner and as supervising physician I was immediately available for consultation/collaboration.   EKG Interpretation   Date/Time:  Monday March 10 2014 16:27:03 EDT Ventricular Rate:  56 PR Interval:  156 QRS Duration: 100 QT Interval:  392 QTC Calculation: 378 R Axis:   47 Text Interpretation:  Sinus rhythm RSR' in V1 or V2, probably normal  variant ED PHYSICIAN INTERPRETATION AVAILABLE IN [facility] Confirmed  by TEST, Record ([id number]) on 03/12/2014 7:31:00 AM       Raeford Razor, MD 03/13/14 [phone]

## 2014-03-18 NOTE — Progress Notes (Signed)
Quick Note:  Shared normal MR Brain with patient, she verbalized understanding ______

## 2014-04-14 ENCOUNTER — Ambulatory Visit (INDEPENDENT_AMBULATORY_CARE_PROVIDER_SITE_OTHER): Payer: No Typology Code available for payment source | Admitting: Nurse Practitioner

## 2014-04-14 ENCOUNTER — Encounter: Payer: Self-pay | Admitting: Nurse Practitioner

## 2014-04-14 VITALS — BP 117/63 | HR 64 | Ht 69.0 in | Wt 251.6 lb

## 2014-04-14 DIAGNOSIS — G43909 Migraine, unspecified, not intractable, without status migrainosus: Secondary | ICD-10-CM

## 2014-04-14 NOTE — Progress Notes (Signed)
[facility]  PATIENT: Alyssa Owen DOB: 1985-11-22   REASON FOR VISIT: follow up for migraines   HISTORY OF PRESENT ILLNESS: Alyssa Owen, 28 year old female returns for followup. She was initially evaluated for migraine by  Dr. Terrace Arabia 03/12/2014. At that time she had been off her Topamax for over a year, and she was given Depacon IV in the office however that was not beneficial. She was also given some Toradol IM. She is back on her Topamax now and uses Maxalt acutely. Her headaches are in good control. She returns for reevaluation. She is only aware of a few migraine triggers  HISTORY: Alyssa Owen is a 28 years old right-handed African American female, referred by her primary care physician Dr. Jola Schmidt for evaluation of migraine headaches  She has migraine since middle school, her typical migraine are right retro-orbital area severe pounding headaches, with associated light, noise sensitivity, she has tried amitriptyline 50 mg every night, Topamax 25 mg twice a day, which has worked very well for her, while she taking preventive medications, she only has one to 2 migraine each month, Maxalt has been very helpful, but she suffered a side effect of excessive drowsiness, hung over, decreased sex drive while taking amitriptyline 50 mg every night, there was no significant side effect from Topamax  She has run out her prescription for one year, began to have increased frequency of headaches over the past 6 months, getting worse over the past 3 weeks, her headache now lasting for one week, she presented to urgent care, and a [facility] emergency room, twice over the past couple weeks, 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, has tried Excedrin Migraine, Maxalt, diclofenac, without helping her headaches, she was given IV Benadryl, Reglan, Compazine, Toradol, with only temporary relief of her symptoms,  She came in today with a one-week history of right  retro-orbital area pounding headaches, worsening by movement, with associated light sensitivity, nausea, blurry vision, right arm numbness, but no weakness,   REVIEW OF SYSTEMS: Full 14 system review of systems performed and notable only for those listed, all others are neg:  Constitutional: N/A  Cardiovascular: N/A  Ear/Nose/Throat: N/A  Skin: N/A  Eyes: N/A  Respiratory: N/A  Gastroitestinal: N/A  Hematology/Lymphatic: N/A  Endocrine: N/A Musculoskeletal:N/A  Allergy/Immunology: Environmental, food Neurological: History of migraine Psychiatric: N/A Sleep : Frequent wakening   ALLERGIES: Allergies  Allergen Reactions  . Codeine     Scratchy throat, cough, hives  . Oxycodone Other (See Comments)    Hot flashes   . Prednisone Other (See Comments) and Cough    Congestion  . Cephalosporins Rash and Cough    HOME MEDICATIONS: Outpatient Prescriptions Prior to Visit  Medication Sig Dispense Refill  . albuterol (PROVENTIL HFA;VENTOLIN HFA) 108 (90 BASE) MCG/ACT inhaler Inhale 2 puffs into the lungs every 6 (six) hours as needed for wheezing or shortness of breath.      . diclofenac (VOLTAREN) 50 MG EC tablet Take 50 mg by mouth 2 (two) times daily as needed for moderate pain.      Marland Kitchen ibuprofen (ADVIL,MOTRIN) 200 MG tablet Take 600-800 mg by mouth every 6 (six) hours as needed for headache or moderate pain.       . mometasone-formoterol (DULERA) 200-5 MCG/ACT AERO Inhale 2 puffs into the lungs 2 (two) times daily.      . rizatriptan (MAXALT) 5 MG tablet Take 5 mg by mouth daily as needed for migraine.       Marland Kitchen  rizatriptan (MAXALT-MLT) 10 MG disintegrating tablet Take 1 tablet (10 mg total) by mouth as needed for migraine. May repeat in 2 hours if needed  15 tablet  12  . topiramate (TOPAMAX) 100 MG tablet Take 1 tablet (100 mg total) by mouth 2 (two) times daily.  60 tablet  12  . diazepam (VALIUM) 5 MG tablet Take 1 tablet (5 mg total) by mouth 2 (two) times daily.  10 tablet  0    . lidocaine (LIDODERM) 5 % Place 1 patch onto the skin daily. Remove & Discard patch within 12 hours or as directed by MD  10 patch  0  . methylPREDNIsolone (MEDROL DOSPACK) 4 MG tablet follow package directions  21 tablet  0  . nortriptyline (PAMELOR) 10 MG capsule 2 tabs po qhs  60 capsule  12  . valproate (DEPACON) 1,500 mg in sodium chloride 0.9 % 100 mL IVPB        No facility-administered medications prior to visit.    PAST MEDICAL HISTORY: Past Medical History  Diagnosis Date  . Asthma   . Migraine     PAST SURGICAL HISTORY: Past Surgical History  Procedure Laterality Date  . Cholecystectomy    . Rotator cuff repair    . Uterine ablation    . Tubal ligation      FAMILY HISTORY: History reviewed. No pertinent family history.  SOCIAL HISTORY: History   Social History  . Marital Status: Married    Spouse Name: Michelle PiperGuy    Number of Children: 3  . Years of Education: college   Occupational History  .     Social History Main Topics  . Smoking status: Never Smoker   . Smokeless tobacco: Never Used  . Alcohol Use: No  . Drug Use: No  . Sexual Activity: No   Other Topics Concern  . Not on file   Social History Narrative   Patient lives at home with her husband and children. Patient works full time Armed forces operational officerdermatologist.    Education some college   Right handed.   Caffeine sometimes.                 PHYSICAL EXAM  Filed Vitals:   04/14/14 1126  BP: 117/63  Pulse: 64  Height: 5\' 9"  (1.753 m)  Weight: 251 lb 9.6 oz (114.125 kg)   Body mass index is 37.14 kg/(m^2). Generalized: In no acute distress , morbidly obese female Neck: Supple, no carotid bruits  Cardiac: Regular rate rhythm  Pulmonary: Clear to auscultation bilaterally  Musculoskeletal: No deformity  Neurological examination  Mentation: Alert oriented to time, place, history taking, and causual conversation  Cranial nerve II-XII: Pupils were equal round reactive to light. Extraocular movements  were full. Visual field were full on confrontational test. Bilateral fundi were sharp. Facial sensation and strength were normal. Hearing was intact to finger rubbing bilaterally. Uvula tongue midline. Head turning and shoulder shrug and were normal and symmetric.Tongue protrusion into cheek strength was normal.  Motor: Normal tone, bulk and strength.  Coordination: Normal finger to nose, heel-to-shin bilaterally there was no truncal ataxia  Gait: Rising up from seated position without assistance, normal stance, without trunk ataxia, moderate stride, good arm swing, smooth turning, able to perform tiptoe, and heel walking without difficulty.  Reflexes: Brachioradialis 2/2, biceps 2/2, triceps 2/2, patellar 2/2, Achilles 2/2, plantar responses were flexor bilaterally. DIAGNOSTIC DATA (LABS, IMAGING, TESTING) - I reviewed patient records, labs, notes, testing and imaging myself where available.  Lab Results  Component Value Date   WBC 16.6* 03/10/2014   HGB 12.2 03/10/2014   HCT 34.2* 03/10/2014   MCV 84.2 03/10/2014   PLT 212 03/10/2014      Component Value Date/Time   NA 140 03/10/2014 1630   K 3.8 03/10/2014 1630   CL 106 03/10/2014 1630   CO2 24 03/10/2014 1630   GLUCOSE 105* 03/10/2014 1630   BUN 9 03/10/2014 1630   CREATININE 0.72 03/10/2014 1630   CALCIUM 9.1 03/10/2014 1630   PROT 7.1 06/28/2013 2043   ALBUMIN 4.0 06/28/2013 2043   AST 20 06/28/2013 2043   ALT 12 06/28/2013 2043   ALKPHOS 63 06/28/2013 2043   BILITOT 0.5 06/28/2013 2043   GFRNONAA >90 03/10/2014 1630   GFRAA >90 03/10/2014 1630    ASSESSMENT AND PLAN  28 y.o. year old female  has a past medical history of Asthma and Migraine. here to followup. Migraines are in good control now that the  patient is back on Topamax.  Continue Topamax at current dose does not need refills Continue Maxalt acutely, does not need refills Given list of migraine triggers Given basic information or migraines, stress reduction, keeping a diary, etc. Followup in  6-8 months Nilda RiggsNancy Carolyn Katilin Raynes, [facility] LLCGNP, [facility]BC, APRN  [facility] [address], [address] [place], [place] [address] [phone]

## 2014-04-14 NOTE — Patient Instructions (Signed)
Continue Topamax at current dose does not need refills Continue Maxalt acutely, does not need refills Given this of migraine triggers Given basic information or migraines, stress reduction, keeping a diary, etc. Followup in 6-8 months

## 2014-05-09 ENCOUNTER — Emergency Department (HOSPITAL_COMMUNITY): Payer: No Typology Code available for payment source

## 2014-05-09 ENCOUNTER — Encounter (HOSPITAL_COMMUNITY): Payer: Self-pay | Admitting: Emergency Medicine

## 2014-05-09 ENCOUNTER — Emergency Department (HOSPITAL_COMMUNITY)
Admission: EM | Admit: 2014-05-09 | Discharge: 2014-05-09 | Disposition: A | Discharge status: Home / Self Care | Payer: No Typology Code available for payment source | Attending: Emergency Medicine | Admitting: Emergency Medicine

## 2014-05-09 DIAGNOSIS — Y9389 Activity, other specified: Secondary | ICD-10-CM | POA: Insufficient documentation

## 2014-05-09 DIAGNOSIS — S8392XA Sprain of unspecified site of left knee, initial encounter: Secondary | ICD-10-CM

## 2014-05-09 DIAGNOSIS — M62838 Other muscle spasm: Secondary | ICD-10-CM

## 2014-05-09 DIAGNOSIS — G43909 Migraine, unspecified, not intractable, without status migrainosus: Secondary | ICD-10-CM | POA: Diagnosis not present

## 2014-05-09 DIAGNOSIS — M545 Low back pain, unspecified: Secondary | ICD-10-CM

## 2014-05-09 DIAGNOSIS — S0993XA Unspecified injury of face, initial encounter: Secondary | ICD-10-CM | POA: Insufficient documentation

## 2014-05-09 DIAGNOSIS — S199XXA Unspecified injury of neck, initial encounter: Secondary | ICD-10-CM

## 2014-05-09 DIAGNOSIS — Y92410 Unspecified street and highway as the place of occurrence of the external cause: Secondary | ICD-10-CM | POA: Insufficient documentation

## 2014-05-09 DIAGNOSIS — J45909 Unspecified asthma, uncomplicated: Secondary | ICD-10-CM | POA: Insufficient documentation

## 2014-05-09 DIAGNOSIS — S99919A Unspecified injury of unspecified ankle, initial encounter: Secondary | ICD-10-CM | POA: Diagnosis present

## 2014-05-09 DIAGNOSIS — Z79899 Other long term (current) drug therapy: Secondary | ICD-10-CM | POA: Diagnosis not present

## 2014-05-09 DIAGNOSIS — S99929A Unspecified injury of unspecified foot, initial encounter: Secondary | ICD-10-CM

## 2014-05-09 DIAGNOSIS — S8990XA Unspecified injury of unspecified lower leg, initial encounter: Secondary | ICD-10-CM | POA: Insufficient documentation

## 2014-05-09 DIAGNOSIS — IMO0002 Reserved for concepts with insufficient information to code with codable children: Secondary | ICD-10-CM | POA: Diagnosis not present

## 2014-05-09 DIAGNOSIS — M542 Cervicalgia: Secondary | ICD-10-CM

## 2014-05-09 MED ORDER — ONDANSETRON 4 MG PO TBDP
4.0000 mg | ORAL_TABLET | Freq: Once | ORAL | Status: AC
  Administered 2014-05-09: 4 mg via ORAL
  Filled 2014-05-09: qty 1

## 2014-05-09 MED ORDER — OXYCODONE-ACETAMINOPHEN 5-325 MG PO TABS
1.0000 | ORAL_TABLET | Freq: Once | ORAL | Status: AC
  Administered 2014-05-09: 1 via ORAL
  Filled 2014-05-09: qty 1

## 2014-05-09 MED ORDER — OXYCODONE-ACETAMINOPHEN 5-325 MG PO TABS: 1.0000 | ORAL_TABLET | Freq: Four times a day (QID) | ORAL | Status: DC | PRN

## 2014-05-09 MED ORDER — METHOCARBAMOL 500 MG PO TABS: 500.0000 mg | ORAL_TABLET | Freq: Three times a day (TID) | ORAL | Status: DC | PRN

## 2014-05-09 MED ORDER — NAPROXEN 500 MG PO TABS: 500.0000 mg | ORAL_TABLET | Freq: Two times a day (BID) | ORAL | Status: DC | PRN
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
## 2014-05-09 NOTE — ED Notes (Signed)
Patient ambulatory in department, no obvious deformity noted. Bruising noted to left knee, redness noted to left anterior forearm.
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
## 2014-05-12 NOTE — ED Provider Notes (Signed)
Medical screening examination/treatment/procedure(s) were performed by non-physician practitioner and as supervising physician I was immediately available for consultation/collaboration.   EKG Interpretation None        Briana Farner T Yuniel Blaney, MD 05/12/14 1504 

## 2014-07-15 ENCOUNTER — Ambulatory Visit: Payer: No Typology Code available for payment source | Attending: Family Medicine | Admitting: Physical Therapy

## 2014-07-15 DIAGNOSIS — M545 Low back pain: Secondary | ICD-10-CM | POA: Insufficient documentation

## 2014-07-15 DIAGNOSIS — M455 Ankylosing spondylitis of thoracolumbar region: Secondary | ICD-10-CM | POA: Insufficient documentation

## 2014-07-17 ENCOUNTER — Ambulatory Visit: Payer: No Typology Code available for payment source | Admitting: Physical Therapy

## 2014-07-22 ENCOUNTER — Ambulatory Visit: Payer: No Typology Code available for payment source | Admitting: Physical Therapy

## 2014-07-22 DIAGNOSIS — M545 Low back pain: Secondary | ICD-10-CM | POA: Diagnosis not present

## 2014-07-24 ENCOUNTER — Ambulatory Visit: Payer: No Typology Code available for payment source | Admitting: Physical Therapy

## 2014-07-28 ENCOUNTER — Ambulatory Visit: Payer: No Typology Code available for payment source | Admitting: Physical Therapy

## 2014-07-30 ENCOUNTER — Ambulatory Visit: Payer: No Typology Code available for payment source | Admitting: Physical Therapy

## 2014-07-30 DIAGNOSIS — M545 Low back pain: Secondary | ICD-10-CM | POA: Diagnosis not present

## 2014-07-31 ENCOUNTER — Ambulatory Visit: Payer: No Typology Code available for payment source | Admitting: Physical Therapy

## 2014-07-31 DIAGNOSIS — M545 Low back pain: Secondary | ICD-10-CM | POA: Diagnosis not present

## 2014-08-05 ENCOUNTER — Ambulatory Visit: Payer: No Typology Code available for payment source | Admitting: Physical Therapy

## 2014-08-05 DIAGNOSIS — M545 Low back pain: Secondary | ICD-10-CM | POA: Diagnosis not present

## 2014-08-07 ENCOUNTER — Ambulatory Visit: Payer: No Typology Code available for payment source | Admitting: Physical Therapy

## 2014-08-19 ENCOUNTER — Ambulatory Visit: Payer: No Typology Code available for payment source | Attending: Family Medicine | Admitting: Physical Therapy

## 2014-08-19 DIAGNOSIS — M455 Ankylosing spondylitis of thoracolumbar region: Secondary | ICD-10-CM | POA: Diagnosis not present

## 2014-08-19 DIAGNOSIS — M545 Low back pain: Secondary | ICD-10-CM | POA: Diagnosis not present

## 2014-08-26 ENCOUNTER — Ambulatory Visit: Payer: No Typology Code available for payment source | Admitting: Physical Therapy

## 2014-08-28 ENCOUNTER — Ambulatory Visit: Payer: No Typology Code available for payment source | Admitting: Physical Therapy

## 2014-09-01 ENCOUNTER — Ambulatory Visit: Payer: No Typology Code available for payment source | Admitting: Physical Therapy

## 2014-09-11 ENCOUNTER — Ambulatory Visit: Payer: No Typology Code available for payment source | Attending: Family Medicine | Admitting: Physical Therapy

## 2014-09-11 DIAGNOSIS — M455 Ankylosing spondylitis of thoracolumbar region: Secondary | ICD-10-CM | POA: Insufficient documentation

## 2014-09-11 DIAGNOSIS — M545 Low back pain: Secondary | ICD-10-CM | POA: Insufficient documentation

## 2014-10-15 ENCOUNTER — Ambulatory Visit: Payer: No Typology Code available for payment source | Admitting: Nurse Practitioner

## 2014-10-22 ENCOUNTER — Ambulatory Visit: Payer: No Typology Code available for payment source | Admitting: Nurse Practitioner

## 2014-11-13 ENCOUNTER — Emergency Department (HOSPITAL_COMMUNITY)
Admission: EM | Admit: 2014-11-13 | Discharge: 2014-11-13 | Disposition: A | Discharge status: Home / Self Care | Payer: No Typology Code available for payment source | Attending: Emergency Medicine | Admitting: Emergency Medicine

## 2014-11-13 ENCOUNTER — Emergency Department (HOSPITAL_COMMUNITY): Payer: No Typology Code available for payment source

## 2014-11-13 ENCOUNTER — Encounter (HOSPITAL_COMMUNITY): Payer: Self-pay | Admitting: Emergency Medicine

## 2014-11-13 DIAGNOSIS — J45909 Unspecified asthma, uncomplicated: Secondary | ICD-10-CM | POA: Diagnosis not present

## 2014-11-13 DIAGNOSIS — Z7951 Long term (current) use of inhaled steroids: Secondary | ICD-10-CM | POA: Diagnosis not present

## 2014-11-13 DIAGNOSIS — R0789 Other chest pain: Secondary | ICD-10-CM | POA: Diagnosis not present

## 2014-11-13 DIAGNOSIS — R079 Chest pain, unspecified: Secondary | ICD-10-CM | POA: Diagnosis present

## 2014-11-13 DIAGNOSIS — G43909 Migraine, unspecified, not intractable, without status migrainosus: Secondary | ICD-10-CM | POA: Diagnosis not present

## 2014-11-13 DIAGNOSIS — R0781 Pleurodynia: Secondary | ICD-10-CM

## 2014-11-13 DIAGNOSIS — Z79899 Other long term (current) drug therapy: Secondary | ICD-10-CM | POA: Diagnosis not present

## 2014-11-13 LAB — CBC
HCT: 36.7 % (ref 36.0–46.0)
Hemoglobin: 12.9 g/dL (ref 12.0–15.0)
MCH: 30.1 pg (ref 26.0–34.0)
MCHC: 35.1 g/dL (ref 30.0–36.0)
MCV: 85.5 fL (ref 78.0–100.0)
Platelets: 223 10*3/uL (ref 150–400)
RBC: 4.29 MIL/uL (ref 3.87–5.11)
RDW: 13 % (ref 11.5–15.5)
WBC: 10.2 10*3/uL (ref 4.0–10.5)

## 2014-11-13 LAB — D-DIMER, QUANTITATIVE: D-Dimer, Quant: 1.05 ug/mL-FEU — ABNORMAL HIGH (ref 0.00–0.48)

## 2014-11-13 LAB — I-STAT TROPONIN, ED: TROPONIN I, POC: 0 ng/mL (ref 0.00–0.08)

## 2014-11-13 LAB — BASIC METABOLIC PANEL
ANION GAP: 9 (ref 5–15)
BUN: 10 mg/dL (ref 6–23)
CALCIUM: 9.4 mg/dL (ref 8.4–10.5)
CO2: 25 mmol/L (ref 19–32)
Chloride: 107 mmol/L (ref 96–112)
Creatinine, Ser: 0.91 mg/dL (ref 0.50–1.10)
GFR calc Af Amer: >90 mL/min (ref >90)
GFR calc non Af Amer: 84 mL/min — ABNORMAL LOW (ref >90)
Glucose, Bld: 98 mg/dL (ref 70–99)
Potassium: 3.7 mmol/L (ref 3.5–5.1)
SODIUM: 141 mmol/L (ref 135–145)

## 2014-11-13 MED ORDER — KETOROLAC TROMETHAMINE 30 MG/ML IJ SOLN
30.0000 mg | Freq: Once | INTRAMUSCULAR | Status: AC
  Administered 2014-11-13: 30 mg via INTRAVENOUS
  Filled 2014-11-13: qty 1

## 2014-11-13 MED ORDER — HYDROCODONE-ACETAMINOPHEN 5-325 MG PO TABS: 1.0000 | ORAL_TABLET | Freq: Four times a day (QID) | ORAL | Status: DC | PRN

## 2014-11-13 MED ORDER — IOHEXOL 350 MG/ML SOLN
100.0000 mL | Freq: Once | INTRAVENOUS | Status: AC | PRN
  Administered 2014-11-13: 100 mL via INTRAVENOUS

## 2014-11-13 NOTE — ED Notes (Signed)
Pt c/o left sided chest pain that started about 30 mins ago when she was having an asthma attack and panic attack.   Pt states shob, dizziness, "kept myself from vomiting".  Pt used her nebulizer bc she couldn't find her inhaler.

## 2014-11-13 NOTE — ED Provider Notes (Signed)
CSN: [id number]     Arrival date & time 11/13/14  1515 History   First MD Initiated Contact with Patient 11/13/14 1609     Chief Complaint  Patient presents with  . Asthma  . Anxiety  . Chest Pain     Patient is a 29 y.o. female presenting with asthma, anxiety, and chest pain. The history is provided by the patient. No language interpreter was used.  Asthma Associated symptoms include chest pain.  Anxiety Associated symptoms include chest pain.  Chest Pain Associated symptoms: anxiety    Alyssa Owen presents for evaluation of left-sided chest pain. She reports shortness of breath and chest pain that started about one hour prior to ED arrival. The pain is described as a squeezing sensation on the left chest with sharp stabbing feelings when she takes a deep breath. Symptoms are moderate and constant. The pain is nonradiating. She denies any fevers, coughing, vomiting, diarrhea, abdominal pain, leg swelling or pain. She feels short of breath but this feels different than her usual asthma. She has no history of DVT or PE takes no exogenous estrogen. She has a family history with multiple family members with DVT.  Past Medical History  Diagnosis Date  . Asthma   . Migraine    Past Surgical History  Procedure Laterality Date  . Cholecystectomy    . Rotator cuff repair    . Uterine ablation    . Tubal ligation     No family history on file. History  Substance Use Topics  . Smoking status: Never Smoker   . Smokeless tobacco: Never Used  . Alcohol Use: No   OB History    No data available     Review of Systems  Cardiovascular: Positive for chest pain.  All other systems reviewed and are negative.     Allergies  Codeine; Oxycodone; Prednisone; and Cephalosporins  Home Medications   Prior to Admission medications   Medication Sig Start Date End Date Taking? Authorizing Provider  albuterol (PROVENTIL HFA;VENTOLIN HFA) 108 (90 BASE) MCG/ACT inhaler Inhale 2 puffs into the  lungs every 6 (six) hours as needed for wheezing or shortness of breath.    Historical Provider, MD  diclofenac (VOLTAREN) 50 MG EC tablet Take 50 mg by mouth 2 (two) times daily as needed for moderate pain.    Historical Provider, MD  ibuprofen (ADVIL,MOTRIN) 200 MG tablet Take 600-800 mg by mouth every 6 (six) hours as needed for headache or moderate pain.     Historical Provider, MD  methocarbamol (ROBAXIN) 500 MG tablet Take 1 tablet (500 mg total) by mouth every 8 (eight) hours as needed for muscle spasms. 05/09/14   Alyssa Strupp Camprubi-Soms, PA-C  mometasone-formoterol (DULERA) 200-5 MCG/ACT AERO Inhale 2 puffs into the lungs 2 (two) times daily.    Historical Provider, MD  naproxen (NAPROSYN) 500 MG tablet Take 1 tablet (500 mg total) by mouth 2 (two) times daily as needed for mild pain, moderate pain or headache (TAKE WITH MEALS.). 05/09/14   Alyssa Strupp Camprubi-Soms, PA-C  oxyCODONE-acetaminophen (PERCOCET) 5-325 MG per tablet Take 1-2 tablets by mouth every 6 (six) hours as needed for severe pain. 05/09/14   Alyssa Strupp Camprubi-Soms, PA-C  rizatriptan (MAXALT-MLT) 10 MG disintegrating tablet Take 1 tablet (10 mg total) by mouth as needed for migraine. May repeat in 2 hours if needed 03/12/14   [name], MD  topiramate (TOPAMAX) 100 MG tablet Take 1 tablet (100 mg total) by mouth 2 (two) times daily.  03/12/14   Levert FeinsteinYijun Yan, MD   BP 123/80 mmHg  Pulse 107  Temp(Src) 98.5 F (36.9 C) (Oral)  Resp 19  SpO2 100%  LMP 11/12/2014 Physical Exam  Constitutional: She is oriented to person, place, and time. She appears well-developed and well-nourished.  HENT:  Head: Normocephalic and atraumatic.  Cardiovascular: Normal rate and regular rhythm.   No murmur heard. Pulmonary/Chest: Effort normal and breath sounds normal. No respiratory distress.  Abdominal: Soft. There is no tenderness. There is no rebound and no guarding.  Musculoskeletal: She exhibits no edema or tenderness.   Neurological: She is alert and oriented to person, place, and time.  Skin: Skin is warm and dry.  Psychiatric: She has a normal mood and affect. Her behavior is normal.  Nursing note and vitals reviewed.   ED Course  Procedures (including critical care time) Labs Review Labs Reviewed  BASIC METABOLIC PANEL - Abnormal; Notable for the following:    GFR calc non Af Amer 84 (*)    All other components within normal limits  D-DIMER, QUANTITATIVE - Abnormal; Notable for the following:    D-Dimer, Quant 1.05 (*)    All other components within normal limits  CBC  I-STAT TROPOININ, ED    Imaging Review No results found.   EKG Interpretation   Date/Time:  Thursday November 13 2014 15:28:10 EST Ventricular Rate:  104 PR Interval:  170 QRS Duration: 94 QT Interval:  324 QTC Calculation: 426 R Axis:   43 Text Interpretation:  Sinus tachycardia RSR' in V1 or V2, probably normal  variant Baseline wander in lead(s) II III aVF Confirmed by Lincoln Brighamees, [name]  [phone] on 11/13/2014 4:11:25 PM      MDM   Final diagnoses:  Pleuritic chest pain    Patient here for evaluation of pleuritic chest pain. She is low-risk for PE, but does have family history of PE and this was tachycardic on initial evaluation. D-dimer was sent which was elevated. CT scan is negative for acute PE. Examination history is not consistent with ACS, pneumonia, asthma exacerbation. Discussed with patient home care for pleuritic chest pain and treatment with anti-inflammatories. PCP follow-up and return precautions were discussed.    Tilden FossaElizabeth Wakisha Alberts, MD 11/13/14 [phone]

## 2014-11-13 NOTE — Discharge Instructions (Signed)
Take ibuprofen OR aleve, available over the counter for pain.    Chest Wall Pain Chest wall pain is pain in or around the bones and muscles of your chest. It may take up to 6 weeks to get better. It may take longer if you must stay physically active in your work and activities.  CAUSES  Chest wall pain may happen on its own. However, it may be caused by:  A viral illness like the flu.  Injury.  Coughing.  Exercise.  Arthritis.  Fibromyalgia.  Shingles. HOME CARE INSTRUCTIONS   Avoid overtiring physical activity. Try not to strain or perform activities that cause pain. This includes any activities using your chest or your abdominal and side muscles, especially if heavy weights are used.  Put ice on the sore area.  Put ice in a plastic bag.  Place a towel between your skin and the bag.  Leave the ice on for 15-20 minutes per hour while awake for the first 2 days.  Only take over-the-counter or prescription medicines for pain, discomfort, or fever as directed by your caregiver. SEEK IMMEDIATE MEDICAL CARE IF:   Your pain increases, or you are very uncomfortable.  You have a fever.  Your chest pain becomes worse.  You have new, unexplained symptoms.  You have nausea or vomiting.  You feel sweaty or lightheaded.  You have a cough with phlegm (sputum), or you cough up blood. MAKE SURE YOU:   Understand these instructions.  Will watch your condition.  Will get help right away if you are not doing well or get worse. Document Released: 09/26/2005 Document Revised: 12/19/2011 Document Reviewed: 05/23/2011 [facility] Patient Information 2015 [place], [place]. This information is not intended to replace advice given to you by your health care provider. Make sure you discuss any questions you have with your health care provider.

## 2015-02-03 ENCOUNTER — Telehealth: Payer: Self-pay | Admitting: Nurse Practitioner

## 2015-02-03 ENCOUNTER — Ambulatory Visit: Payer: Self-pay | Admitting: Nurse Practitioner

## 2015-02-04 ENCOUNTER — Encounter: Payer: Self-pay | Admitting: Nurse Practitioner

## 2015-02-04 ENCOUNTER — Ambulatory Visit (INDEPENDENT_AMBULATORY_CARE_PROVIDER_SITE_OTHER): Payer: No Typology Code available for payment source | Admitting: Nurse Practitioner

## 2015-02-04 VITALS — BP 110/70 | HR 64 | Ht 69.0 in | Wt 265.0 lb

## 2015-02-04 DIAGNOSIS — G43909 Migraine, unspecified, not intractable, without status migrainosus: Secondary | ICD-10-CM | POA: Diagnosis not present

## 2015-02-04 DIAGNOSIS — R112 Nausea with vomiting, unspecified: Secondary | ICD-10-CM | POA: Diagnosis not present

## 2015-02-04 MED ORDER — TOPIRAMATE 50 MG PO TABS: 250.0000 mg | ORAL_TABLET | Freq: Every day | ORAL | Status: DC

## 2015-02-04 MED ORDER — NORTRIPTYLINE HCL 50 MG PO CAPS: 100.0000 mg | ORAL_CAPSULE | Freq: Every day | ORAL | Status: DC

## 2015-02-04 MED ORDER — RIZATRIPTAN BENZOATE 10 MG PO TBDP: 10.0000 mg | ORAL_TABLET | ORAL | Status: AC | PRN

## 2015-02-04 MED ORDER — PREDNISONE 10 MG PO TABS: 10.0000 mg | ORAL_TABLET | Freq: Every day | ORAL | Status: DC

## 2015-02-04 MED ORDER — PROMETHAZINE HCL 25 MG PO TABS: 25.0000 mg | ORAL_TABLET | Freq: Four times a day (QID) | ORAL | Status: DC | PRN

## 2015-02-04 NOTE — Patient Instructions (Signed)
Increase Topamax to 250 mg daily Rx called in Phenergan 25 mg by mouth when necessary nausea, refilled Continue Maxalt acutely refilled Prednisone 6 day dose pack take as directed Continue nortriptyline at current dose refilled Call back if prednisone Dosepak does not work Follow-up in 3-4 months

## 2015-02-04 NOTE — Progress Notes (Signed)
I have reviewed and agreed above plan. 

## 2015-02-04 NOTE — Progress Notes (Signed)
[facility]  PATIENT: Alyssa Owen DOB: 1986-03-08   REASON FOR VISIT: Follow-up for migraine lasting 4 days, nausea vomiting HISTORY FROM: Patient    HISTORY OF PRESENT ILLNESS:Alyssa Owen, 29 year old female returns for followup. She was last seen in the office 04/14/2014 and initially evaluated for migraine by Dr. Terrace Arabia 03/12/2014. Her headaches had been doing well and she stopped her Topamax however she resumed it about 3 months ago. Her current migraine going on for approximately 4 days. She is taking Maxalt Excedrin Migraine and ibuprofen without much relief. She was vomiting yesterday. She has nothing to take for nausea. She  has been given Depacon IV in the office  before and it was not beneficial. She was also given some Toradol IM. She claims she is not be eating foods that are migraine triggers. She does have asthma and had an exacerbation last month. She also has seasonal allergies. She does not have a true allergy to prednisone it has caused a cough in the past. She returns for reevaluation   HISTORY: Alyssa Owen is a 29 years old right-handed African American female, referred by her primary care physician Dr. Jola Schmidt for evaluation of migraine headaches  She has migraine since middle school, her typical migraine are right retro-orbital area severe pounding headaches, with associated light, noise sensitivity, she has tried amitriptyline 50 mg every night, Topamax 25 mg twice a day, which has worked very well for her, while she taking preventive medications, she only has one to 2 migraine each month, Maxalt has been very helpful, but she suffered a side effect of excessive drowsiness, hung over, decreased sex drive while taking amitriptyline 50 mg every night, there was no significant side effect from Topamax  She has run out her prescription for one year, began to have increased frequency of headaches over the past 6 months, getting worse over the past 3 weeks,  her headache now lasting for one week, she presented to urgent care, and a [facility] emergency room, twice over the past couple weeks, 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, has tried Excedrin Migraine, Maxalt, diclofenac, without helping her headaches, she was given IV Benadryl, Reglan, Compazine, Toradol, with only temporary relief of her symptoms,  She came in today with a one-week history of right retro-orbital area pounding headaches, worsening by movement, with associated light sensitivity, nausea, blurry vision, right arm numbness, but no weakness,    REVIEW OF SYSTEMS: Full 14 system review of systems performed and notable only for those listed, all others are neg:  Constitutional: neg  Cardiovascular: neg Ear/Nose/Throat: neg  Skin: neg Eyes: blurred vision Respiratory: neg Gastroitestinal: neg  Hematology/Lymphatic: neg  Endocrine: neg Musculoskeletal:neg Allergy/Immunology: neg Neurological: dizziness, headache Psychiatric: neg Sleep : neg   ALLERGIES: Allergies  Allergen Reactions  . Codeine     Scratchy throat, cough, hives  . Oxycodone Other (See Comments)    Hot flashes   . Prednisone Other (See Comments) and Cough    Congestion  . Cephalosporins Rash and Cough    HOME MEDICATIONS: Outpatient Prescriptions Prior to Visit  Medication Sig Dispense Refill  . albuterol (PROVENTIL HFA;VENTOLIN HFA) 108 (90 BASE) MCG/ACT inhaler Inhale 2 puffs into the lungs every 6 (six) hours as needed for wheezing or shortness of breath.    Marland Kitchen albuterol (PROVENTIL) (2.5 MG/3ML) 0.083% nebulizer solution Take 2.5 mg by nebulization every 6 (six) hours as needed for wheezing or shortness of breath.    Marland Kitchen  nortriptyline (PAMELOR) 50 MG capsule Take 100 mg by mouth at bedtime.    . rizatriptan (MAXALT-MLT) 10 MG disintegrating tablet Take 1 tablet (10 mg total) by mouth as needed for migraine. May repeat in 2 hours if needed 15 tablet 12  . topiramate  (TOPAMAX) 100 MG tablet Take 1 tablet (100 mg total) by mouth 2 (two) times daily. (Patient taking differently: Take 200 mg by mouth at bedtime. ) 60 tablet 12  . HYDROcodone-acetaminophen (NORCO/VICODIN) 5-325 MG per tablet Take 1 tablet by mouth every 6 (six) hours as needed for moderate pain or severe pain. 6 tablet 0   No facility-administered medications prior to visit.    PAST MEDICAL HISTORY: Past Medical History  Diagnosis Date  . Asthma   . Migraine     PAST SURGICAL HISTORY: Past Surgical History  Procedure Laterality Date  . Cholecystectomy    . Rotator cuff repair    . Uterine ablation    . Tubal ligation      FAMILY HISTORY: History reviewed. No pertinent family history.  SOCIAL HISTORY: History   Social History  . Marital Status: Married    Spouse Name: Michelle PiperGuy  . Number of Children: 3  . Years of Education: college   Occupational History  .     Social History Main Topics  . Smoking status: Never Smoker   . Smokeless tobacco: Never Used  . Alcohol Use: No  . Drug Use: No  . Sexual Activity: No   Other Topics Concern  . Not on file   Social History Narrative   Patient lives at home with her husband and children. Patient is not working at this time.   Education some college   Right handed.   Caffeine sometimes.                 PHYSICAL EXAM  Filed Vitals:   02/04/15 0907  BP: 110/70  Pulse: 64  Height: 5\' 9"  (1.753 m)  Weight: 265 lb (120.203 kg)   Body mass index is 39.12 kg/(m^2). Generalized: In no acute distress , morbidly obese female Neck: Supple, no carotid bruits  Cardiac: Regular rate rhythm  Musculoskeletal: No deformity  Neurological examination  Mentation: Alert oriented to time, place, history taking, and causual conversation  Cranial nerve II-XII: Pupils were equal round reactive to light. Extraocular movements were full. Visual field were full on confrontational test. Bilateral fundi deferred due to migraine .  Facial sensation and strength were normal. Hearing was intact to finger rubbing bilaterally. Uvula tongue midline. Head turning and shoulder shrug and were normal and symmetric.Tongue protrusion into cheek strength was normal.  Motor: Normal tone, bulk and strength. No focal weakness Coordination: Normal finger to nose, heel-to-shin bilaterally there was no truncal ataxia  Gait: Rising up from seated position without assistance, normal stance, without trunk ataxia, moderate stride, good arm swing, smooth turning, able to perform tiptoe, and heel walking without difficulty.  Reflexes: Brachioradialis 2/2, biceps 2/2, triceps 2/2, patellar 2/2, Achilles 2/2, plantar responses were flexor bilaterally  DIAGNOSTIC DATA (LABS, IMAGING, TESTING) - I reviewed patient records, labs, notes, testing and imaging myself where available.  Lab Results  Component Value Date   WBC 10.2 11/13/2014   HGB 12.9 11/13/2014   HCT 36.7 11/13/2014   MCV 85.5 11/13/2014   PLT 223 11/13/2014      Component Value Date/Time   NA 141 11/13/2014 1557   K 3.7 11/13/2014 1557   CL 107 11/13/2014 1557  CO2 25 11/13/2014 1557   GLUCOSE 98 11/13/2014 1557   BUN 10 11/13/2014 1557   CREATININE 0.91 11/13/2014 1557   CALCIUM 9.4 11/13/2014 1557   PROT 7.1 06/28/2013 2043   ALBUMIN 4.0 06/28/2013 2043   AST 20 06/28/2013 2043   ALT 12 06/28/2013 2043   ALKPHOS 63 06/28/2013 2043   BILITOT 0.5 06/28/2013 2043   GFRNONAA 84* 11/13/2014 1557   GFRAA >90 11/13/2014 1557    ASSESSMENT AND PLAN  29 y.o. year old female  has a past medical history of Asthma and Migraine. here to follow-up for a migraine that has been going on for 4 days with nausea and vomiting.  Increase Topamax to 250 mg daily Rx called in Phenergan 25 mg by mouth when necessary for nausea, refilled Continue Maxalt acutely refilled Prednisone 6 day dose pack take as directed Continue nortriptyline at current dose refilled Call back if  prednisone Dosepak does not work Increased fluid intake, Gatorade to replace electrolytes being lost while vomiting I spent 15 additional minutes  in total face to face time with the patient more than 50% of which was spent counseling and coordination of care, reviewing previous care everywhere chart, reviewing medications and discussing and reviewing the diagnosis of migraine and next steps if prednisone and increase in preventive medication does not work.  Follow-up in 3-4 months Nilda Riggs, [facility], [facility], APRN  [facility] [address] [place], [place] [address] [phone]

## 2015-03-11 NOTE — Telephone Encounter (Signed)
Error

## 2015-05-07 ENCOUNTER — Ambulatory Visit: Payer: No Typology Code available for payment source | Admitting: Nurse Practitioner

## 2015-05-08 ENCOUNTER — Ambulatory Visit: Payer: No Typology Code available for payment source | Admitting: Nurse Practitioner

## 2016-01-30 ENCOUNTER — Emergency Department (HOSPITAL_BASED_OUTPATIENT_CLINIC_OR_DEPARTMENT_OTHER)
Admission: EM | Admit: 2016-01-30 | Discharge: 2016-01-30 | Disposition: A | Discharge status: Home / Self Care | Payer: Medicaid Other | Attending: Emergency Medicine | Admitting: Emergency Medicine

## 2016-01-30 ENCOUNTER — Encounter (HOSPITAL_BASED_OUTPATIENT_CLINIC_OR_DEPARTMENT_OTHER): Payer: Self-pay | Admitting: *Deleted

## 2016-01-30 ENCOUNTER — Emergency Department (HOSPITAL_BASED_OUTPATIENT_CLINIC_OR_DEPARTMENT_OTHER): Payer: Medicaid Other

## 2016-01-30 DIAGNOSIS — Z79899 Other long term (current) drug therapy: Secondary | ICD-10-CM | POA: Insufficient documentation

## 2016-01-30 DIAGNOSIS — Y9389 Activity, other specified: Secondary | ICD-10-CM | POA: Diagnosis not present

## 2016-01-30 DIAGNOSIS — J45909 Unspecified asthma, uncomplicated: Secondary | ICD-10-CM | POA: Insufficient documentation

## 2016-01-30 DIAGNOSIS — S93602A Unspecified sprain of left foot, initial encounter: Secondary | ICD-10-CM | POA: Diagnosis not present

## 2016-01-30 DIAGNOSIS — Y998 Other external cause status: Secondary | ICD-10-CM | POA: Insufficient documentation

## 2016-01-30 DIAGNOSIS — W28XXXA Contact with powered lawn mower, initial encounter: Secondary | ICD-10-CM | POA: Diagnosis not present

## 2016-01-30 DIAGNOSIS — G43909 Migraine, unspecified, not intractable, without status migrainosus: Secondary | ICD-10-CM | POA: Insufficient documentation

## 2016-01-30 DIAGNOSIS — Y9289 Other specified places as the place of occurrence of the external cause: Secondary | ICD-10-CM | POA: Diagnosis not present

## 2016-01-30 DIAGNOSIS — S99922A Unspecified injury of left foot, initial encounter: Secondary | ICD-10-CM | POA: Diagnosis present

## 2016-01-30 MED ORDER — HYDROCODONE-ACETAMINOPHEN 5-325 MG PO TABS
1.0000 | ORAL_TABLET | Freq: Once | ORAL | Status: AC
  Administered 2016-01-30: 1 via ORAL
  Filled 2016-01-30: qty 1

## 2016-01-30 MED ORDER — NAPROXEN 500 MG PO TABS
500.0000 mg | ORAL_TABLET | Freq: Two times a day (BID) | ORAL | Status: DC
Start: 2016-01-30 — End: 2016-10-14

## 2016-01-30 NOTE — Discharge Instructions (Signed)
Foot Sprain °A foot sprain is an injury to one of the strong bands of tissue (ligaments) that connect and support the many bones in your feet. The ligament can be stretched too much or it can tear. A tear can be either partial or complete. The severity of the sprain depends on how much of the ligament was damaged or torn. °CAUSES °A foot sprain is usually caused by suddenly twisting or pivoting your foot. °RISK FACTORS °This injury is more likely to occur in people who: °· Play a sport, such as basketball or football. °· Exercise or play a sport without warming up. °· Start a new workout or sport. °· Suddenly increase how long or hard they exercise or play a sport. °SYMPTOMS °Symptoms of this condition start soon after an injury and include: °· Pain, especially in the arch of the foot. °· Bruising. °· Swelling. °· Inability to walk or use the foot to support body weight. °DIAGNOSIS °This condition is diagnosed with a medical history and physical exam. You may also have imaging tests, such as: °· X-rays to make sure there are no broken bones (fractures). °· MRI to see if the ligament has torn. °TREATMENT °Treatment varies depending on the severity of your sprain. Mild sprains can be treated with rest, ice, compression, and elevation (RICE). If your ligament is overstretched or partially torn, treatment usually involves keeping your foot in a fixed position (immobilization) for a period of time. To help you do this, your health care provider will apply a bandage, splint, or walking boot to keep your foot from moving until it heals. You may also be advised to use crutches or a scooter for a few weeks to avoid bearing weight on your foot while it is healing. °If your ligament is fully torn, you may need surgery to reconnect the ligament to the bone. After surgery, a cast or splint will be applied and will need to stay on your foot while it heals. °Your health care provider may also suggest exercises or physical therapy  to strengthen your foot. °HOME CARE INSTRUCTIONS °If You Have a Bandage, Splint, or Walking Boot: °· Wear it as directed by your health care provider. Remove it only as directed by your health care provider. °· Loosen the bandage, splint, or walking boot if your toes become numb and tingle, or if they turn cold and blue. °Bathing °· If your health care provider approves bathing and showering, cover the bandage or splint with a watertight plastic bag to protect it from water. Do not let the bandage or splint get wet. °Managing Pain, Stiffness, and Swelling  °· If directed, apply ice to the injured area: °¨ Put ice in a plastic bag. °¨ Place a towel between your skin and the bag. °¨ Leave the ice on for 20 minutes, 2-3 times per day. °· Move your toes often to avoid stiffness and to lessen swelling. °· Raise (elevate) the injured area above the level of your heart while you are sitting or lying down. °Driving °· Do not drive or operate heavy machinery while taking pain medicine. °· Do not drive while wearing a bandage, splint, or walking boot on a foot that you use for driving. °Activity °· Rest as directed by your health care provider. °· Do not use the injured foot to support your body weight until your health care provider says that you can. Use crutches or other supportive devices as directed by your health care provider. °· Ask your health care   provider what activities are safe for you. Gradually increase how much and how far you walk until your health care provider says it is safe to return to full activity. °· Do any exercise or physical therapy as directed by your health care provider. °General Instructions °· If a splint was applied, do not put pressure on any part of it until it is fully hardened. This may take several hours. °· Take medicines only as directed by your health care provider. These include over-the-counter medicines and prescription medicines. °· Keep all follow-up visits as directed by your  health care provider. This is important. °· When you can walk without pain, wear supportive shoes that have stiff soles. Do not wear flip-flops, and do not walk barefoot. °SEEK MEDICAL CARE IF: °· Your pain is not controlled with medicine. °· Your bruising or swelling gets worse or does not get better with treatment. °· Your splint or walking boot is damaged. °SEEK IMMEDIATE MEDICAL CARE IF: °· Your foot is numb or blue. °· Your foot feels colder than normal. °  °This information is not intended to replace advice given to you by your health care provider. Make sure you discuss any questions you have with your health care provider. °  °Document Released: 03/18/2002 Document Revised: 02/10/2015 Document Reviewed: 07/30/2014 °Elsevier Interactive Patient Education ©2016 Elsevier Inc[employer] ° °Elastic Bandage and RICE °WHAT DOES AN ELASTIC BANDAGE DO? °Elastic bandages come in different shapes and sizes. They generally provide support to your injury and reduce swelling while you are healing, but they can perform different functions. Your health care provider will help you to decide what is best for your protection, recovery, or rehabilitation following an injury. °WHAT ARE SOME GENERAL TIPS FOR USING AN ELASTIC BANDAGE? °· Use the bandage as directed by the maker of the bandage that you are using. °· Do not wrap the bandage too tightly. This may cut off the circulation in the arm or leg in the area below the bandage. °¨ If part of your body beyond the bandage becomes blue, numb, cold, swollen, or is more painful, your bandage is most likely too tight. If this occurs, remove your bandage and reapply it more loosely. °· See your health care provider if the bandage seems to be making your problems worse rather than better. °· An elastic bandage should be removed and reapplied every 3-4 hours or as directed by your health care provider. °WHAT IS RICE? °The routine care of many injuries includes rest, ice, compression, and  elevation (RICE therapy).  °Rest °Rest is required to allow your body to heal. Generally, you can resume your routine activities when you are comfortable and have been given permission by your health care provider. °Ice °Icing your injury helps to keep the swelling down and it reduces pain. Do not apply ice directly to your skin. °· Put ice in a plastic bag. °· Place a towel between your skin and the bag. °· Leave the ice on for 20 minutes, 2-3 times per day. °Do this for as long as you are directed by your health care provider. °Compression °Compression helps to keep swelling down, gives support, and helps with discomfort. Compression may be done with an elastic bandage. °Elevation °Elevation helps to reduce swelling and it decreases pain. If possible, your injured area should be placed at or above the level of your heart or the center of your chest. °WHEN SHOULD I SEEK MEDICAL CARE? °You should seek medical care if: °· You have persistent   pain and swelling. °· Your symptoms are getting worse rather than improving. °These symptoms may indicate that further evaluation or further X-rays are needed. Sometimes, X-rays may not show a small broken bone (fracture) until a number of days later. Make a follow-up appointment with your health care provider. Ask when your X-ray results will be ready. Make sure that you get your X-ray results. °WHEN SHOULD I SEEK IMMEDIATE MEDICAL CARE? °You should seek immediate medical care if: °· You have a sudden onset of severe pain at or below the area of your injury. °· You develop redness or increased swelling around your injury. °· You have tingling or numbness at or below the area of your injury that does not improve after you remove the elastic bandage. °  °This information is not intended to replace advice given to you by your health care provider. Make sure you discuss any questions you have with your health care provider. °  °Document Released: 03/18/2002 Document Revised:  06/17/2015 Document Reviewed: 05/12/2014 °Elsevier Interactive Patient Education ©2016 Elsevier Inc. ° °

## 2016-01-30 NOTE — ED Provider Notes (Signed)
CSN: [id number]     Arrival date & time 01/30/16  1250 History   First MD Initiated Contact with Patient 01/30/16 1354     Chief Complaint  Patient presents with  . Foot Injury    Alyssa Owen is a 30 y.o. female who presents to the ED complaining of left foot pain after she tripped on a lawnmower earlier today. Patient reports she was placing some items in a freezer when she tripped on a commercial lawnmower hitting the top of her left foot. She complains of pain on the dorsal aspect of her left foot. No left ankle pain. No numbness or tingling or weakness. She complains of 8 out of 10 pain currently. She took 600 mg ibuprofen before arrival today. She reports she's not been able to handle it on her foot has been using crutches. No fevers, numbness, tingling, weakness, head injury or other injury.  Patient is a 30 y.o. female presenting with foot injury. The history is provided by the patient. No language interpreter was used.  Foot Injury Associated symptoms: no back pain, no fever and no neck pain     Past Medical History  Diagnosis Date  . Asthma   . Migraine    Past Surgical History  Procedure Laterality Date  . Cholecystectomy    . Rotator cuff repair    . Uterine ablation    . Tubal ligation    . Abdominal hysterectomy     No family history on file. Social History  Substance Use Topics  . Smoking status: Never Smoker   . Smokeless tobacco: Never Used  . Alcohol Use: No   OB History    No data available     Review of Systems  Constitutional: Negative for fever.  Musculoskeletal: Positive for arthralgias. Negative for back pain and neck pain.  Skin: Negative for rash and wound.  Neurological: Negative for weakness and numbness.      Allergies  Bee pollen; Codeine; Oxycodone; Prednisone; and Cephalosporins  Home Medications   Prior to Admission medications   Medication Sig Start Date End Date Taking? Authorizing Provider  albuterol (PROVENTIL HFA;VENTOLIN  HFA) 108 (90 BASE) MCG/ACT inhaler Inhale 2 puffs into the lungs every 6 (six) hours as needed for wheezing or shortness of breath.    Historical Provider, MD  albuterol (PROVENTIL) (2.5 MG/3ML) 0.083% nebulizer solution Take 2.5 mg by nebulization every 6 (six) hours as needed for wheezing or shortness of breath.    Historical Provider, MD  naproxen (NAPROSYN) 500 MG tablet Take 1 tablet (500 mg total) by mouth 2 (two) times daily with a meal. 01/30/16   Everlene Farrier, PA-C  nortriptyline (PAMELOR) 50 MG capsule Take 2 capsules (100 mg total) by mouth at bedtime. 02/04/15   Nilda Riggs, NP  predniSONE (DELTASONE) 10 MG tablet Take 1 tablet (10 mg total) by mouth daily with breakfast. 6 day dose pack take as directed 02/04/15   Nilda Riggs, NP  promethazine (PHENERGAN) 25 MG tablet Take 1 tablet (25 mg total) by mouth every 6 (six) hours as needed for nausea or vomiting. 02/04/15   Nilda Riggs, NP  rizatriptan (MAXALT-MLT) 10 MG disintegrating tablet Take 1 tablet (10 mg total) by mouth as needed for migraine. May repeat in 2 hours if needed 02/04/15   Nilda Riggs, NP  topiramate (TOPAMAX) 50 MG tablet Take 5 tablets (250 mg total) by mouth daily. 02/04/15   Nilda Riggs, NP   BP 112/63 mmHg  Pulse 64  Temp(Src) 98.8 F (37.1 C) (Oral)  Resp 16  SpO2 100% Physical Exam  Constitutional: She appears well-developed and well-nourished. No distress.  HENT:  Head: Normocephalic and atraumatic.  Eyes: Right eye exhibits no discharge. Left eye exhibits no discharge.  Cardiovascular: Normal rate, regular rhythm and intact distal pulses.   Bilateral dorsalis pedis and posterior tibialis pulses are intact. Good capillary refill to her left distal toes.  Pulmonary/Chest: Effort normal. No respiratory distress.  Musculoskeletal: Normal range of motion. She exhibits edema and tenderness.  Patient has tenderness with mild edema over the dorsal aspect of her left foot.  No obvious deformity. Good range of motion of her distal left toes. No ankle tenderness to palpation. Good range of motion of her left ankle without pain. No left knee tenderness to palpation.  Neurological: She is alert. Coordination normal.  Sensation is intact in her bilateral lower extremities.  Skin: Skin is warm and dry. No rash noted. She is not diaphoretic.  Psychiatric: She has a normal mood and affect. Her behavior is normal.  Nursing note and vitals reviewed.   ED Course  Procedures (including critical care time) Labs Review Labs Reviewed - No data to display  Imaging Review Dg Foot Complete Left  01/30/2016  CLINICAL DATA:  Patient reports falling today at home in her garage. Her left foot became caught underneath a Surveyor, mininglawn mower as she fell forward. C/O pain on dorsal surface of left foot at the level of the base of the metatarsals. Small laceration on the dorsal surface of the base of the third metatarsal. Bruising and swelling of the entire foot. EXAM: LEFT FOOT - COMPLETE 3+ VIEW COMPARISON:  None. FINDINGS: There is soft tissue swelling along the dorsum of the foot, in the region of the metatarsal heads. No radiopaque foreign body or soft tissue gas identified. No evidence for acute fracture or traumatic subluxation. IMPRESSION: 1. Mild soft tissue swelling. 2.  No evidence for acute osseous abnormality. Electronically Signed   By: Norva PavlovElizabeth  Posthumus M.D.   On: 01/30/2016 14:27   I have personally reviewed and evaluated these images as part of my medical decision-making.   EKG Interpretation None      Filed Vitals:   01/30/16 1423  BP: 112/63  Pulse: 64  Temp: 98.8 F (37.1 C)  TempSrc: Oral  Resp: 16  SpO2: 100%     MDM   Meds given in ED:  Medications  HYDROcodone-acetaminophen (NORCO/VICODIN) 5-325 MG per tablet 1 tablet (1 tablet Oral Given 01/30/16 1441)    New Prescriptions   NAPROXEN (NAPROSYN) 500 MG TABLET    Take 1 tablet (500 mg total) by mouth 2 (two)  times daily with a meal.    Final diagnoses:  Foot sprain, left, initial encounter   This is a 30 y.o. female who presents to the ED complaining of left foot pain after she tripped on a lawnmower earlier today. Patient reports she was placing some items in a freezer when she tripped on a commercial lawnmower hitting the top of her left foot. She complains of pain on the dorsal aspect of her left foot. No left ankle pain. No numbness or tingling or weakness. On exam the patient is afebrile and nontoxic. She has some mild edema and tenderness over the dorsal aspect of her left foot. No deformity. She is neurovascularly intact. X-ray is unremarkable. Patient is using crutches. Will provide with ACE wrap. I discussed RICE therapy. I encouraged her to follow  up with PCP and orthopedic surgery if her pain persists. I advised the patient to follow-up with their primary care provider this week. I advised the patient to return to the emergency department with new or worsening symptoms or new concerns. The patient verbalized understanding and agreement with plan.      Everlene Farrier, PA-C 01/30/16 1500  Pricilla Loveless, MD 01/30/16 [phone]

## 2016-01-30 NOTE — ED Notes (Signed)
Patient twisted and hit left foot on a lawn mower today, hurts to ambulate

## 2016-04-06 ENCOUNTER — Emergency Department (HOSPITAL_COMMUNITY): Payer: 59

## 2016-04-06 ENCOUNTER — Encounter (HOSPITAL_COMMUNITY): Payer: Self-pay | Admitting: Emergency Medicine

## 2016-04-06 ENCOUNTER — Emergency Department (HOSPITAL_COMMUNITY)
Admission: EM | Admit: 2016-04-06 | Discharge: 2016-04-06 | Disposition: A | Discharge status: Home / Self Care | Payer: 59 | Attending: Emergency Medicine | Admitting: Emergency Medicine

## 2016-04-06 DIAGNOSIS — R1031 Right lower quadrant pain: Secondary | ICD-10-CM | POA: Diagnosis not present

## 2016-04-06 DIAGNOSIS — Z791 Long term (current) use of non-steroidal anti-inflammatories (NSAID): Secondary | ICD-10-CM | POA: Insufficient documentation

## 2016-04-06 DIAGNOSIS — J45909 Unspecified asthma, uncomplicated: Secondary | ICD-10-CM | POA: Insufficient documentation

## 2016-04-06 DIAGNOSIS — Z79899 Other long term (current) drug therapy: Secondary | ICD-10-CM | POA: Insufficient documentation

## 2016-04-06 LAB — URINALYSIS, ROUTINE W REFLEX MICROSCOPIC
Bilirubin Urine: NEGATIVE
GLUCOSE, UA: NEGATIVE mg/dL
HGB URINE DIPSTICK: NEGATIVE
Ketones, ur: NEGATIVE mg/dL
LEUKOCYTES UA: NEGATIVE
Nitrite: NEGATIVE
PH: 7 (ref 5.0–8.0)
PROTEIN: NEGATIVE mg/dL
SPECIFIC GRAVITY, URINE: 1.028 (ref 1.005–1.030)

## 2016-04-06 LAB — CBC
HEMATOCRIT: 35 % — AB (ref 36.0–46.0)
HEMOGLOBIN: 12.2 g/dL (ref 12.0–15.0)
MCH: 29 pg (ref 26.0–34.0)
MCHC: 34.9 g/dL (ref 30.0–36.0)
MCV: 83.3 fL (ref 78.0–100.0)
PLATELETS: 219 10*3/uL (ref 150–400)
RBC: 4.2 MIL/uL (ref 3.87–5.11)
RDW: 13.9 % (ref 11.5–15.5)
WBC: 5.2 10*3/uL (ref 4.0–10.5)

## 2016-04-06 LAB — LIPASE, BLOOD: Lipase: 18 U/L (ref 11–51)

## 2016-04-06 LAB — COMPREHENSIVE METABOLIC PANEL
ALBUMIN: 4.1 g/dL (ref 3.5–5.0)
ALT: 11 U/L — ABNORMAL LOW (ref 14–54)
AST: 18 U/L (ref 15–41)
Alkaline Phosphatase: 53 U/L (ref 38–126)
Anion gap: 5 (ref 5–15)
BUN: 9 mg/dL (ref 6–20)
CO2: 25 mmol/L (ref 22–32)
CREATININE: 0.67 mg/dL (ref 0.44–1.00)
Calcium: 8.9 mg/dL (ref 8.9–10.3)
Chloride: 110 mmol/L (ref 101–111)
GFR calc non Af Amer: >60 mL/min (ref >60)
Glucose, Bld: 95 mg/dL (ref 65–99)
POTASSIUM: 4 mmol/L (ref 3.5–5.1)
SODIUM: 140 mmol/L (ref 135–145)
Total Bilirubin: 0.8 mg/dL (ref 0.3–1.2)
Total Protein: 7 g/dL (ref 6.5–8.1)

## 2016-04-06 MED ORDER — ONDANSETRON HCL 4 MG/2ML IJ SOLN
4.0000 mg | Freq: Once | INTRAMUSCULAR | Status: AC
  Administered 2016-04-06: 4 mg via INTRAVENOUS
  Filled 2016-04-06: qty 2

## 2016-04-06 MED ORDER — HYDROMORPHONE HCL 1 MG/ML IJ SOLN
1.0000 mg | Freq: Once | INTRAMUSCULAR | Status: AC
  Administered 2016-04-06: 1 mg via INTRAVENOUS
  Filled 2016-04-06: qty 1

## 2016-04-06 MED ORDER — HYDROCODONE-ACETAMINOPHEN 5-325 MG PO TABS: 1.0000 | ORAL_TABLET | ORAL | Status: DC | PRN

## 2016-04-06 MED ORDER — IOPAMIDOL (ISOVUE-300) INJECTION 61%
100.0000 mL | Freq: Once | INTRAVENOUS | Status: AC | PRN
  Administered 2016-04-06: 100 mL via INTRAVENOUS

## 2016-04-06 NOTE — ED Notes (Signed)
Pt reports having hives on her face and bila arms.  [facility] EDP notified and wants to just watch the reaction at this time.  Family and patient made aware.

## 2016-04-06 NOTE — Discharge Instructions (Signed)

## 2016-04-06 NOTE — ED Notes (Signed)
Pt c/o constant dull RLQ ab pain with intermittent sharp pain onset this morning radiating into back. 2 loose stools with nausea last night. Hx hysterectomy. Ovaries intact. No emesis.

## 2016-04-06 NOTE — ED Provider Notes (Signed)
CSN: [id number]     Arrival date & time 04/06/16  [id number] History   First MD Initiated Contact with Patient 04/06/16 [phone]     Chief Complaint  Patient presents with  . Abdominal Pain      Patient is a 30 y.o. female presenting with abdominal pain. The history is provided by the patient.  Abdominal Pain Associated symptoms: no chest pain, no chills, no dysuria and no shortness of breath   Patient presents with pain in her right lower abdomen. She woke with it around 7:00 this morning. Is severe. States it feels, like when she had her gallbladder out for. Patient is a different location. She's had a previous abdominal hysterectomy. No fevers or chills. Pain is severe. No diarrhea or constipation. No nausea vomiting. No dysuria.  Past Medical History  Diagnosis Date  . Asthma   . Migraine    Past Surgical History  Procedure Laterality Date  . Cholecystectomy    . Rotator cuff repair    . Uterine ablation    . Tubal ligation    . Abdominal hysterectomy     History reviewed. No pertinent family history. Social History  Substance Use Topics  . Smoking status: Never Smoker   . Smokeless tobacco: Never Used  . Alcohol Use: No   OB History    No data available     Review of Systems  Constitutional: Negative for chills.  Respiratory: Negative for shortness of breath.   Cardiovascular: Negative for chest pain.  Gastrointestinal: Positive for abdominal pain.  Genitourinary: Positive for flank pain. Negative for dysuria and difficulty urinating.  Musculoskeletal: Negative for back pain.  Skin: Negative for wound.  Neurological: Negative for seizures.      Allergies  Other; Tomato; Bee pollen; Codeine; Prednisone; Cephalosporins; and Oxycodone  Home Medications   Prior to Admission medications   Medication Sig Start Date End Date Taking? Authorizing Provider  ibuprofen (ADVIL,MOTRIN) 200 MG tablet Take 400 mg by mouth every 6 (six) hours as needed for moderate pain.   Yes  Historical Provider, MD  nortriptyline (PAMELOR) 50 MG capsule Take 2 capsules (100 mg total) by mouth at bedtime. 02/04/15  Yes Nilda RiggsNancy Carolyn Martin, NP  rizatriptan (MAXALT-MLT) 10 MG disintegrating tablet Take 1 tablet (10 mg total) by mouth as needed for migraine. May repeat in 2 hours if needed 02/04/15  Yes Nilda RiggsNancy Carolyn Martin, NP  topiramate (TOPAMAX) 50 MG tablet Take 5 tablets (250 mg total) by mouth daily. 02/04/15  Yes Nilda RiggsNancy Carolyn Martin, NP  HYDROcodone-acetaminophen (NORCO/VICODIN) 5-325 MG tablet Take 1-2 tablets by mouth every 4 (four) hours as needed. 04/06/16   Benjiman CoreNathan Para Cossey, MD  naproxen (NAPROSYN) 500 MG tablet Take 1 tablet (500 mg total) by mouth 2 (two) times daily with a meal. Patient not taking: Reported on 04/06/2016 01/30/16   Everlene FarrierWilliam Dansie, PA-C  predniSONE (DELTASONE) 10 MG tablet Take 1 tablet (10 mg total) by mouth daily with breakfast. 6 day dose pack take as directed Patient not taking: Reported on 04/06/2016 02/04/15   Nilda RiggsNancy Carolyn Martin, NP  promethazine (PHENERGAN) 25 MG tablet Take 1 tablet (25 mg total) by mouth every 6 (six) hours as needed for nausea or vomiting. Patient not taking: Reported on 04/06/2016 02/04/15   Nilda RiggsNancy Carolyn Martin, NP   BP 107/61 mmHg  Pulse 63  Temp(Src) 98.5 F (36.9 C) (Oral)  Resp 18  SpO2 100%  LMP 11/12/2014 Physical Exam  Constitutional: She appears well-developed.  Patient appears in pain and  has a tear on the left side of her face.  HENT:  Head: Atraumatic.  Cardiovascular: Normal rate.   Pulmonary/Chest: Effort normal.  Abdominal: There is tenderness.  Moderate right lower quadrant tenderness. Also right-sided CVA tenderness.  Musculoskeletal: Normal range of motion.  Neurological: She is alert.  Skin: Skin is warm.    ED Course  Procedures (including critical care time) Labs Review Labs Reviewed  COMPREHENSIVE METABOLIC PANEL - Abnormal; Notable for the following:    ALT 11 (*)    All other components  within normal limits  CBC - Abnormal; Notable for the following:    HCT 35.0 (*)    All other components within normal limits  URINALYSIS, ROUTINE W REFLEX MICROSCOPIC (NOT AT [facility]) - Abnormal; Notable for the following:    Color, Urine AMBER (*)    APPearance CLOUDY (*)    All other components within normal limits  LIPASE, BLOOD    Imaging Review Kore[place] Transvaginal Non-ob  04/06/2016  CLINICAL DATA:  Right lower quadrant pain began today. Hysterectomy. EXAM: TRANSABDOMINAL AND TRANSVAGINAL ULTRASOUND OF PELVIS DOPPLER ULTRASOUND OF OVARIES TECHNIQUE: Both transabdominal and transvaginal ultrasound examinations of the pelvis were performed. Transabdominal technique was performed for global imaging of the pelvis including uterus, ovaries, adnexal regions, and pelvic cul-de-sac. It was necessary to proceed with endovaginal exam following the transabdominal exam to visualize the ovaries. Color and duplex Doppler ultrasound was utilized to evaluate blood flow to the ovaries. COMPARISON:  None. FINDINGS: Uterus Measurements: . Surgically absent Endometrium Thickness: .  Surgically absent Right ovary Measurements: 2.8 x 2.1 x 2.2 cm. Normal-appearing follicles. Normal appearance/no adnexal mass. Left ovary Measurements: 2.6 x 1.60.7 cm. Normal follicles. Normal appearance/no adnexal mass. Pulsed Doppler evaluation of both ovaries demonstrates normal low-resistance arterial and venous waveforms. Other findings No abnormal free fluid. IMPRESSION: No acute abnormality.  No evidence of mass or ovarian torsion. Hysterectomy Electronically Signed   By: Marlan Palauharles  Clark M.D.   On: 04/06/2016 13:58   [place] Pelvis Complete  04/06/2016  CLINICAL DATA:  Right lower quadrant pain since this morning. EXAM: TRANSABDOMINAL AND TRANSVAGINAL ULTRASOUND OF PELVIS DOPPLER ULTRASOUND OF OVARIES TECHNIQUE: Both transabdominal and transvaginal ultrasound examinations of the pelvis were performed. Transabdominal technique was performed  for global imaging of the pelvis including uterus, ovaries, adnexal regions, and pelvic cul-de-sac. It was necessary to proceed with endovaginal exam following the transabdominal exam to visualize the ovaries. Color and duplex Doppler ultrasound was utilized to evaluate blood flow to the ovaries. COMPARISON:  CT scan from earlier today. FINDINGS: Uterus Measurements: Surgically absent. Endometrium Thickness: Surgically absent. Right ovary Measurements: 2.8 x 2.1 x 2.2 Cm. Normal appearance/no adnexal mass. Left ovary Measurements: 2.6 x 1.6 x 1.7 cm. Normal appearance/no adnexal mass. Pulsed Doppler evaluation of both ovaries demonstrates normal low-resistance arterial and venous waveforms. Other findings No abnormal free fluid. IMPRESSION: Status post hysterectomy.  Otherwise normal exam. Electronically Signed   By: Kennith CenterEric  Mansell M.D.   On: 04/06/2016 14:01   Ct Abdomen Pelvis W Contrast  04/06/2016  CLINICAL DATA:  Right lower quadrant pain EXAM: CT ABDOMEN AND PELVIS WITH CONTRAST TECHNIQUE: Multidetector CT imaging of the abdomen and pelvis was performed using the standard protocol following bolus administration of intravenous contrast. CONTRAST:  100mL ISOVUE-300 IOPAMIDOL (ISOVUE-300) INJECTION 61% COMPARISON:  None. FINDINGS: Lower chest:  Lung bases clear Hepatobiliary: Cholecystectomy. Bile ducts nondilated. No focal liver lesion. Pancreas: Negative Spleen: Negative Adrenals/Urinary Tract: Normal renal size and contour. No mass or obstruction.  No urinary tract calculi. Urinary bladder empty and appears normal. Stomach/Bowel: Stomach and duodenum appear normal. Negative for bowel obstruction. No bowel mass or edema. Normal appendix. Vascular/Lymphatic: Normal aorta and IVC.  No lymphadenopathy. Reproductive: Hysterectomy. No pelvic mass lesion. Bilateral ovaries identified. Other: No free-fluid Musculoskeletal: Negative IMPRESSION: Normal appendix.  No acute abnormality. Cholecystectomy. Electronically  Signed   By: Marlan Palau M.D.   On: 04/06/2016 12:17   [place] Art/ven Flow Abd Pelv Doppler  04/06/2016  CLINICAL DATA:  Right lower quadrant pain since this morning. EXAM: TRANSABDOMINAL AND TRANSVAGINAL ULTRASOUND OF PELVIS DOPPLER ULTRASOUND OF OVARIES TECHNIQUE: Both transabdominal and transvaginal ultrasound examinations of the pelvis were performed. Transabdominal technique was performed for global imaging of the pelvis including uterus, ovaries, adnexal regions, and pelvic cul-de-sac. It was necessary to proceed with endovaginal exam following the transabdominal exam to visualize the ovaries. Color and duplex Doppler ultrasound was utilized to evaluate blood flow to the ovaries. COMPARISON:  CT scan from earlier today. FINDINGS: Uterus Measurements: Surgically absent. Endometrium Thickness: Surgically absent. Right ovary Measurements: 2.8 x 2.1 x 2.2 Cm. Normal appearance/no adnexal mass. Left ovary Measurements: 2.6 x 1.6 x 1.7 cm. Normal appearance/no adnexal mass. Pulsed Doppler evaluation of both ovaries demonstrates normal low-resistance arterial and venous waveforms. Other findings No abnormal free fluid. IMPRESSION: Status post hysterectomy.  Otherwise normal exam. Electronically Signed   By: Kennith Center M.D.   On: 04/06/2016 14:01   I have personally reviewed and evaluated these images and lab results as part of my medical decision-making.   EKG Interpretation None      MDM   Final diagnoses:  RLQ abdominal pain  Patient presented with severe right lower quadrant abdominal pain. Lab work overall reassuring. CT scan done and did not show clear cause. Patient sought ovaries despite hysterectomy and ultrasound done. Also did not show a cause of pain. Patient feels better. She'll be discharged home with follow-up as needed. Zoster consider discussed with patient.   Benjiman Core, MD 04/06/16 1452

## 2016-09-07 ENCOUNTER — Encounter (HOSPITAL_COMMUNITY): Payer: Self-pay | Admitting: Emergency Medicine

## 2016-09-07 ENCOUNTER — Ambulatory Visit (HOSPITAL_COMMUNITY)
Admission: EM | Admit: 2016-09-07 | Discharge: 2016-09-07 | Disposition: A | Discharge status: Home / Self Care | Payer: 59 | Attending: Family Medicine | Admitting: Family Medicine

## 2016-09-07 DIAGNOSIS — R002 Palpitations: Secondary | ICD-10-CM

## 2016-09-07 NOTE — ED Triage Notes (Signed)
PT reports chest pain and palpitations that started on Thanksgiving. PT reports these episodes come and go. When they occur, PT is SOB and is nasueated. PT has vomited twice. No cardiac history.

## 2016-09-07 NOTE — ED Provider Notes (Signed)
CSN: [id number]     Arrival date & time 09/07/16  1834 History   First MD Initiated Contact with Patient 09/07/16 1949     Chief Complaint  Patient presents with  . Chest Pain   (Consider location/radiation/quality/duration/timing/severity/associated sxs/prior Treatment) Alyssa Owen is a 30 y.o female, with hx of asthma and migraine, presents today for heart palpitation, fluttering, and chest pain intermittent since Thanksgiving. She is currently asymptomatic except for a headache. Patient reports the episodes of heart palpitation and fluttering comes and goes and would last a few seconds when it occurs. When patient have these episodes, she would also experience dizziness, some shortness of breath, and nausea. She denies recent stress, anxiety, or increased caffeine intake. She also reports feeling extremely fatigue recently. Patient denies any cardiovascular history.       Past Medical History:  Diagnosis Date  . Asthma   . Migraine    Past Surgical History:  Procedure Laterality Date  . ABDOMINAL HYSTERECTOMY    . CHOLECYSTECTOMY    . ROTATOR CUFF REPAIR    . TUBAL LIGATION    . uterine ablation     No family history on file. Social History  Substance Use Topics  . Smoking status: Never Smoker  . Smokeless tobacco: Never Used  . Alcohol use 0.0 oz/week     Comment: rarely   OB History    No data available     Review of Systems  Constitutional: Positive for fatigue. Negative for chills and fever.  Eyes: Negative for visual disturbance.  Respiratory: Positive for shortness of breath. Negative for wheezing.   Cardiovascular: Positive for chest pain and palpitations. Negative for leg swelling.  Gastrointestinal: Positive for nausea. Negative for abdominal pain and vomiting.  Neurological: Positive for dizziness and headaches. Negative for weakness.    Allergies  Other; Tomato; Bee pollen; Codeine; Prednisone; Cephalosporins; and Oxycodone  Home Medications   Prior  to Admission medications   Medication Sig Start Date End Date Taking? Authorizing Provider  HYDROcodone-acetaminophen (NORCO/VICODIN) 5-325 MG tablet Take 1-2 tablets by mouth every 4 (four) hours as needed. 04/06/16   Benjiman CoreNathan Pickering, MD  ibuprofen (ADVIL,MOTRIN) 200 MG tablet Take 400 mg by mouth every 6 (six) hours as needed for moderate pain.    Historical Provider, MD  naproxen (NAPROSYN) 500 MG tablet Take 1 tablet (500 mg total) by mouth 2 (two) times daily with a meal. Patient not taking: Reported on 04/06/2016 01/30/16   Everlene FarrierWilliam Dansie, PA-C  nortriptyline (PAMELOR) 50 MG capsule Take 2 capsules (100 mg total) by mouth at bedtime. 02/04/15   Nilda RiggsNancy Carolyn Martin, NP  predniSONE (DELTASONE) 10 MG tablet Take 1 tablet (10 mg total) by mouth daily with breakfast. 6 day dose pack take as directed Patient not taking: Reported on 04/06/2016 02/04/15   Nilda RiggsNancy Carolyn Martin, NP  promethazine (PHENERGAN) 25 MG tablet Take 1 tablet (25 mg total) by mouth every 6 (six) hours as needed for nausea or vomiting. Patient not taking: Reported on 04/06/2016 02/04/15   Nilda RiggsNancy Carolyn Martin, NP  rizatriptan (MAXALT-MLT) 10 MG disintegrating tablet Take 1 tablet (10 mg total) by mouth as needed for migraine. May repeat in 2 hours if needed 02/04/15   Nilda RiggsNancy Carolyn Martin, NP  topiramate (TOPAMAX) 50 MG tablet Take 5 tablets (250 mg total) by mouth daily. 02/04/15   Nilda RiggsNancy Carolyn Martin, NP   Meds Ordered and Administered this Visit  Medications - No data to display  BP 127/70   Pulse 69  Temp 98.5 F (36.9 C) (Oral)   Resp 16   Ht 5\' 9"  (1.753 m)   Wt 247 lb (112 kg)   LMP 11/12/2014   SpO2 100%   BMI 36.48 kg/m  No data found.   Physical Exam  Constitutional: She is oriented to person, place, and time. She appears well-developed and well-nourished.  HENT:  Head: Normocephalic and atraumatic.  Right Ear: External ear normal.  Left Ear: External ear normal.  Nose: Nose normal.  Mouth/Throat:  Oropharynx is clear and moist. No oropharyngeal exudate.  TM normal bilaterally  Eyes: EOM are normal. Pupils are equal, round, and reactive to light.  Neck: Normal range of motion. Neck supple.  Cardiovascular: Normal rate.   Murmur heard. Has an irregular rhythm, Has a grade 1/6 systolic murmur most prominent at the pulmonic area.    Pulmonary/Chest: Effort normal and breath sounds normal. No respiratory distress. She has no wheezes.  Abdominal: Soft. Bowel sounds are normal. She exhibits no distension. There is no tenderness.  Neurological: She is alert and oriented to person, place, and time.  Skin: Skin is warm and dry.  Psychiatric: She has a normal mood and affect.  Nursing note and vitals reviewed.   Urgent Care Course   Clinical Course     Procedures (including critical care time)  Labs Review Labs Reviewed - No data to display  Imaging Review No results found.  MDM   1. Heart palpitations    Patient does not have chest pain, palpitation, fluttering, dizziness or nausea currently. She reports her palpitation comes and goes and lasts a few second when it occurs. EKG has sinus arrhythmia but no heart block, ST segment deviation, PVC, PAC, no T wave inversion, no evidence of pre-excitation or prolonged QT interval and no evidence of chamber enlargement.  Patient is safe to be discharged home. [facility] to have further workup outpatient with cardiologist. Patient informed that she may benefit from Holter monitor and possible ECHO as she was found to have a heart murmur on today's visit. Patient informed that her PCP can also order a Holter and ECHO.   Plan of care clearly explained. ER return precaution did discussed. Patient denies any questions.    Alyssa EstelleFeng Christia Domke, NP 09/07/16 2022

## 2016-09-21 ENCOUNTER — Telehealth: Payer: Self-pay | Admitting: Cardiology

## 2016-09-21 NOTE — Telephone Encounter (Signed)
Received [facility] from [facility] for appointment on 10/14/16 with Dr Jens Somrenshaw.  [facility] for Dr Ludwig Clarksrenshaw's appointment on 10/14/16. lp

## 2016-10-09 NOTE — Progress Notes (Signed)
HPI: 30 yo female for evaluation of palpitations. Seen in ER 11/17 with palpitations. ECG showed sinus with RVCD. Patient states that for several years she has had intermittent palpitations. They are described as her heart racing anywhere from 20 seconds to 1 minute. She has some dizziness but no syncope. Some dyspnea but no chest pain. Patient not related to activities. They resolve spontaneously. She otherwise denies dyspnea on exertion, orthopnea, PND, pedal edema, exertional chest pain.  Current Outpatient Prescriptions  Medication Sig Dispense Refill  . ibuprofen (ADVIL,MOTRIN) 200 MG tablet Take 400 mg by mouth every 6 (six) hours as needed for moderate pain.    . nortriptyline (PAMELOR) 50 MG capsule Take 2 capsules (100 mg total) by mouth at bedtime. 60 capsule 6  . rizatriptan (MAXALT-MLT) 10 MG disintegrating tablet Take 1 tablet (10 mg total) by mouth as needed for migraine. May repeat in 2 hours if needed 15 tablet 12  . topiramate (TOPAMAX) 50 MG tablet Take 5 tablets (250 mg total) by mouth daily. 150 tablet 6   No current facility-administered medications for this visit.     Allergies  Allergen Reactions  . Bee Pollen Anaphylaxis  . Other Anaphylaxis    mushroom  . Tomato Anaphylaxis  . Cephalosporins Rash and Cough  . Codeine Hives and Cough  . Oxycodone Nausea Only and Other (See Comments)    Hot flashes   . Prednisone Other (See Comments) and Cough    Congestion      Past Medical History:  Diagnosis Date  . Asthma   . IBS (irritable bowel syndrome)   . Migraine     Past Surgical History:  Procedure Laterality Date  . ABDOMINAL HYSTERECTOMY    . CHOLECYSTECTOMY    . KNEE SURGERY    . ROTATOR CUFF REPAIR    . TUBAL LIGATION    . uterine ablation      Social History   Social History  . Marital status: Married    Spouse name: Michelle PiperGuy  . Number of children: 3  . Years of education: college   Occupational History  .  Dermatology Specialist    Social History Main Topics  . Smoking status: Never Smoker  . Smokeless tobacco: Never Used  . Alcohol use 0.0 oz/week     Comment: rarely  . Drug use: No  . Sexual activity: No   Other Topics Concern  . Not on file   Social History Narrative   Patient lives at home with her husband and children. Patient is not working at this time.   Education some college   Right handed.   Caffeine sometimes.                History reviewed. No pertinent family history.  ROS: no fevers or chills, productive cough, hemoptysis, dysphasia, odynophagia, melena, hematochezia, dysuria, hematuria, rash, seizure activity, orthopnea, PND, pedal edema, claudication. Remaining systems are negative.  Physical Exam:   Blood pressure 126/88, pulse 72, height 5\' 10"  (1.778 m), weight 256 lb (116.1 kg), last menstrual period 11/12/2014.  General:  Well developed/well nourished in NAD Skin warm/dry Patient not depressed No peripheral clubbing Back-normal HEENT-normal/normal eyelids Neck supple/normal carotid upstroke bilaterally; no bruits; no JVD; no thyromegaly chest - CTA/ normal expansion CV - RRR/normal S1 and S2; no murmurs, rubs or gallops;  PMI nondisplaced Abdomen -NT/ND, no HSM, no mass, + bowel sounds, no bruit 2+ femoral pulses, no bruits Ext-no edema, chords, 2+ DP Neuro-grossly nonfocal  A/P  1 palpitations-etiology unclear. We will arrange an echocardiogram to assess LV function. Schedule event monitor. Check TSH.  2 Asthma-management per primary care.   Olga MillersBrian Quida Glasser, MD

## 2016-10-14 ENCOUNTER — Ambulatory Visit (INDEPENDENT_AMBULATORY_CARE_PROVIDER_SITE_OTHER): Payer: 59 | Admitting: Cardiology

## 2016-10-14 ENCOUNTER — Encounter: Payer: Self-pay | Admitting: Cardiology

## 2016-10-14 VITALS — BP 126/88 | HR 72 | Ht 70.0 in | Wt 256.0 lb

## 2016-10-14 DIAGNOSIS — R002 Palpitations: Secondary | ICD-10-CM | POA: Diagnosis not present

## 2016-10-14 LAB — TSH: TSH: 1.72 mIU/L

## 2016-10-14 NOTE — Patient Instructions (Signed)
Medication Instructions:   NO CHANGE  Labwork:  Your physician recommends that you HAVE LAB WORK TODAY  Testing/Procedures:  Your physician has requested that you have an echocardiogram. Echocardiography is a painless test that uses sound waves to create images of your heart. It provides your doctor with information about the size and shape of your heart and how well your heart's chambers and valves are working. This procedure takes approximately one hour. There are no restrictions for this procedure.   Your physician has recommended that you wear an event monitor. Event monitors are medical devices that record the heart's electrical activity. Doctors most often us these monitors to diagnose arrhythmias. Arrhythmias are problems with the speed or rhythm of the heartbeat. The monitor is a small, portable device. You can wear one while you do your normal daily activities. This is usually used to diagnose what is causing palpitations/syncope (passing out).    Follow-Up:  Your physician recommends that you schedule a follow-up appointment in: 8 WEEKS WITH DR Jens SomRENSHAW

## 2016-10-17 ENCOUNTER — Encounter: Payer: Self-pay | Admitting: *Deleted

## 2016-10-17 NOTE — Progress Notes (Signed)
Patient ID: Alyssa ShellingJammie Fulford, female   DOB: 1986-03-21, 31 y.o.   MRN: [id number] Patient enrolled for [employer] to mail a Body guardian 30 day cardiac event monitor her home.

## 2016-10-24 ENCOUNTER — Encounter (INDEPENDENT_AMBULATORY_CARE_PROVIDER_SITE_OTHER): Payer: 59

## 2016-10-24 DIAGNOSIS — R002 Palpitations: Secondary | ICD-10-CM

## 2016-11-04 ENCOUNTER — Other Ambulatory Visit (HOSPITAL_COMMUNITY): Payer: 59

## 2016-11-18 ENCOUNTER — Ambulatory Visit (HOSPITAL_COMMUNITY): Payer: 59 | Attending: Internal Medicine

## 2016-11-18 ENCOUNTER — Other Ambulatory Visit: Payer: Self-pay

## 2016-11-18 DIAGNOSIS — R002 Palpitations: Secondary | ICD-10-CM | POA: Diagnosis present

## 2016-11-22 ENCOUNTER — Telehealth: Payer: Self-pay | Admitting: Cardiology

## 2016-11-22 NOTE — Telephone Encounter (Signed)
Pt would like her echo results from Friday please.

## 2016-11-22 NOTE — Telephone Encounter (Signed)
Returned call, discussed results of normal echo. Pt voiced understanding, but stated concern due to observed "extra beats" on echo. Explained that the technician observed these too. Explained the outcome of the test and that anatomical findings were normal, no concern for pumping volume of heart, etc. Pt aware that the palpitations will be better analyzed on event monitor. Noting that, she informs me she sent in monitor after 2 weeks, had a reaction to adhesive.  Pt aware I'll let Dr. Jens Somrenshaw know, plan to keep f/u as scheduled. She expressed thanks.

## 2016-12-09 ENCOUNTER — Encounter: Payer: Self-pay | Admitting: Cardiology

## 2016-12-12 NOTE — Progress Notes (Deleted)
      HPI: FU palpitations. Monitor in January 2018 showed sinus bradycardia, sinus rhythm and sinus tachycardia. Echocardiogram February 2018 showed normal LV function. TSH 1.72. Since last seen,   Current Outpatient Prescriptions  Medication Sig Dispense Refill  . ibuprofen (ADVIL,MOTRIN) 200 MG tablet Take 400 mg by mouth every 6 (six) hours as needed for moderate pain.    . nortriptyline (PAMELOR) 50 MG capsule Take 2 capsules (100 mg total) by mouth at bedtime. 60 capsule 6  . rizatriptan (MAXALT-MLT) 10 MG disintegrating tablet Take 1 tablet (10 mg total) by mouth as needed for migraine. May repeat in 2 hours if needed 15 tablet 12  . topiramate (TOPAMAX) 50 MG tablet Take 5 tablets (250 mg total) by mouth daily. 150 tablet 6   No current facility-administered medications for this visit.      Past Medical History:  Diagnosis Date  . Asthma   . IBS (irritable bowel syndrome)   . Migraine     Past Surgical History:  Procedure Laterality Date  . ABDOMINAL HYSTERECTOMY    . CHOLECYSTECTOMY    . KNEE SURGERY    . ROTATOR CUFF REPAIR    . TUBAL LIGATION    . uterine ablation      Social History   Social History  . Marital status: Married    Spouse name: Michelle PiperGuy  . Number of children: 3  . Years of education: college   Occupational History  .  Dermatology Specialist   Social History Main Topics  . Smoking status: Never Smoker  . Smokeless tobacco: Never Used  . Alcohol use 0.0 oz/week     Comment: rarely  . Drug use: No  . Sexual activity: No   Other Topics Concern  . Not on file   Social History Narrative   Patient lives at home with her husband and children. Patient is not working at this time.   Education some college   Right handed.   Caffeine sometimes.                No family history on file.  ROS: no fevers or chills, productive cough, hemoptysis, dysphasia, odynophagia, melena, hematochezia, dysuria, hematuria, rash, seizure activity,  orthopnea, PND, pedal edema, claudication. Remaining systems are negative.  Physical Exam: Well-developed well-nourished in no acute distress.  Skin is warm and dry.  HEENT is normal.  Neck is supple.  Chest is clear to auscultation with normal expansion.  Cardiovascular exam is regular rate and rhythm.  Abdominal exam nontender or distended. No masses palpated. Extremities show no edema. neuro grossly intact  ECG- personally reviewed  A/P  1  Alyssa MillersBrian Promise Bushong, MD

## 2016-12-16 ENCOUNTER — Ambulatory Visit: Payer: 59 | Admitting: Cardiology

## 2017-02-11 ENCOUNTER — Emergency Department (HOSPITAL_COMMUNITY): Payer: Medicaid Other

## 2017-02-11 ENCOUNTER — Encounter (HOSPITAL_COMMUNITY): Payer: Self-pay | Admitting: Emergency Medicine

## 2017-02-11 ENCOUNTER — Emergency Department (HOSPITAL_COMMUNITY)
Admission: EM | Admit: 2017-02-11 | Discharge: 2017-02-11 | Disposition: A | Discharge status: Home / Self Care | Payer: Medicaid Other | Attending: Emergency Medicine | Admitting: Emergency Medicine

## 2017-02-11 DIAGNOSIS — Y9289 Other specified places as the place of occurrence of the external cause: Secondary | ICD-10-CM | POA: Diagnosis not present

## 2017-02-11 DIAGNOSIS — S93402A Sprain of unspecified ligament of left ankle, initial encounter: Secondary | ICD-10-CM | POA: Diagnosis not present

## 2017-02-11 DIAGNOSIS — S99912A Unspecified injury of left ankle, initial encounter: Secondary | ICD-10-CM | POA: Diagnosis present

## 2017-02-11 DIAGNOSIS — Y999 Unspecified external cause status: Secondary | ICD-10-CM | POA: Diagnosis not present

## 2017-02-11 DIAGNOSIS — W500XXA Accidental hit or strike by another person, initial encounter: Secondary | ICD-10-CM | POA: Diagnosis not present

## 2017-02-11 DIAGNOSIS — Y9389 Activity, other specified: Secondary | ICD-10-CM | POA: Diagnosis not present

## 2017-02-11 DIAGNOSIS — J45909 Unspecified asthma, uncomplicated: Secondary | ICD-10-CM | POA: Insufficient documentation

## 2017-02-11 MED ORDER — MELOXICAM 15 MG PO TABS
15.0000 mg | ORAL_TABLET | Freq: Every day | ORAL | 0 refills | Status: DC
Start: 2017-02-11 — End: 2017-10-13

## 2017-02-11 NOTE — ED Provider Notes (Signed)
[facility]-EMERGENCY DEPT Provider Note   CSN: [id number] Arrival date & time: 02/11/17  0705     History   Chief Complaint Chief Complaint  Patient presents with  . Foot Pain    HPI Alyssa Owen is a 31 y.o. female who presents with c/o L ankle pain. She complains of inversion injury to the left ankle 1 days ago. There is pain and swelling at the lateral aspect of that ankle. The patient was not able to bear weight directly after the injury.    HPI  Past Medical History:  Diagnosis Date  . Asthma   . IBS (irritable bowel syndrome)   . Migraine     Patient Active Problem List   Diagnosis Date Noted  . Nausea with vomiting 02/04/2015  . Asthma   . Migraine     Past Surgical History:  Procedure Laterality Date  . ABDOMINAL HYSTERECTOMY    . CHOLECYSTECTOMY    . KNEE SURGERY    . ROTATOR CUFF REPAIR    . TUBAL LIGATION    . uterine ablation      OB History    No data available       Home Medications    Prior to Admission medications   Medication Sig Start Date End Date Taking? Authorizing Provider  ibuprofen (ADVIL,MOTRIN) 200 MG tablet Take 400 mg by mouth every 6 (six) hours as needed for moderate pain.    [provider]  nortriptyline (PAMELOR) 50 MG capsule Take 2 capsules (100 mg total) by mouth at bedtime. 02/04/15   Nilda RiggsMartin, Nancy Carolyn, NP  rizatriptan (MAXALT-MLT) 10 MG disintegrating tablet Take 1 tablet (10 mg total) by mouth as needed for migraine. May repeat in 2 hours if needed 02/04/15   Nilda RiggsMartin, Nancy Carolyn, NP  topiramate (TOPAMAX) 50 MG tablet Take 5 tablets (250 mg total) by mouth daily. 02/04/15   Nilda RiggsMartin, Nancy Carolyn, NP    Family History No family history on file.  Social History Social History  Substance Use Topics  . Smoking status: Never Smoker  . Smokeless tobacco: Never Used  . Alcohol use 0.0 oz/week     Comment: rarely     Allergies   Bee pollen; Other; Tomato; Cephalosporins; Codeine; Oxycodone; and  Prednisone   Review of Systems Review of Systems  Ten systems reviewed and are negative for acute change, except as noted in the HPI.    Physical Exam Updated Vital Signs BP 108/65 (BP Location: Left Arm)   Pulse 62   Temp 97.6 F (36.4 C) (Oral)   Resp 18   LMP 11/12/2014   SpO2 100%   Physical Exam  Constitutional: She is oriented to person, place, and time. She appears well-developed and well-nourished. No distress.  HENT:  Head: Normocephalic and atraumatic.  Eyes: Conjunctivae are normal. No scleral icterus.  Neck: Normal range of motion.  Cardiovascular: Normal rate, regular rhythm and normal heart sounds.  Exam reveals no gallop and no friction rub.   No murmur heard. Pulmonary/Chest: Effort normal and breath sounds normal. No respiratory distress.  Abdominal: Soft. Bowel sounds are normal. She exhibits no distension and no mass. There is no tenderness. There is no guarding.  Musculoskeletal:  There is swelling and tenderness over the lateral malleolus.No overt deformity. No tenderness over the medial aspect of the ankle. The fifth metatarsal is not tender. The ankle joint is intact without excessive opening on stressing.   Neurological: She is alert and oriented to person, place, and time.  Skin: Skin is warm and dry. She is not diaphoretic.  Psychiatric: Her behavior is normal.  Nursing note and vitals reviewed.    ED Treatments / Results  Labs (all labs ordered are listed, but only abnormal results are displayed) Labs Reviewed - No data to display  EKG  EKG Interpretation None       Radiology Dg Ankle Complete Left  Result Date: 02/11/2017 CLINICAL DATA:  Left ankle pain after injury last night EXAM: LEFT ANKLE COMPLETE - 3+ VIEW COMPARISON:  None. FINDINGS: Mild diffuse left ankle soft tissue swelling. No left ankle fracture or subluxation. No suspicious focal osseous lesion, appreciable arthropathy or radiopaque foreign body. IMPRESSION: Mild diffuse  left ankle soft tissue swelling, with no left ankle fracture or subluxation. Electronically Signed   By: Delbert Phenix M.D.   On: 02/11/2017 08:29   Dg Foot Complete Left  Result Date: 02/11/2017 CLINICAL DATA:  Left foot pain after injury last night. EXAM: LEFT FOOT - COMPLETE 3+ VIEW COMPARISON:  None. FINDINGS: There is no evidence of fracture or dislocation. There is no evidence of arthropathy or other focal bone abnormality. Soft tissues are unremarkable. IMPRESSION: Normal left foot. Electronically Signed   By: Lupita Raider, M.D.   On: 02/11/2017 08:31    Procedures Procedures (including critical care time)  Medications Ordered in ED Medications - No data to display   Initial Impression / Assessment and Plan / ED Course  I have reviewed the triage vital signs and the nursing notes.  Pertinent labs & imaging results that were available during my care of the patient were reviewed by me and considered in my medical decision making (see chart for details).     Patient X-Ray negative for obvious fracture or dislocation. Pain managed in ED.  Placed in cam walker and crutches. Home Care: Rest and elevate the injured ankle, apply ice intermittently. Use crutches without weight bearing until able to comfortable bear partial weight, then progress to full weight bearing as tolerated. . See ortho prn.  Patient will be dc home & is agreeable with above plan.   Final Clinical Impressions(s) / ED Diagnoses   Final diagnoses:  None    New Prescriptions New Prescriptions   No medications on file     Arthor Captain, PA-C 02/11/17 0940    Arthor Captain, PA-C 02/11/17 0940    Tegeler, Canary Brim, MD 02/11/17 [phone]

## 2017-02-11 NOTE — ED Notes (Signed)
Patient offered wheelchair, patient stated she would prefer to use her crutches to walk out.

## 2017-02-11 NOTE — Progress Notes (Signed)
Orthopedic Tech Progress Note Patient Details:  Alyssa Owen 11-22-1985 [id number]  Ortho Devices Type of Ortho Device: Crutches Ortho Device/Splint Interventions: Adjustment   Saul FordyceJennifer C Kyra Laffey 02/11/2017, 9:31 AM

## 2017-02-11 NOTE — Discharge Instructions (Signed)
Your caregiver has diagnosed you as suffering from an ankle sprain. Ankle sprain occurs when the ligaments that hold the ankle joint together are stretched or torn. It may take 4 to 6 weeks to heal. °For Activity: Use crutches with non-weight bearing for the first few days. Then, you may walk on your ankle as the pain allows, or as instructed. Start gradually with weight bearing on the affected ankle. Once you can walk pain free, then try jogging. When you can run forwards, then you can try moving side-to-side. If you cannot walk without crutches in one week, you need a re-check. °SEEK IMMEDIATE MEDICAL ATTENTION IF: your toes are numb or tingling, appear gray or blue, or you have severe pain (also elevate leg and loosen splint). ° °

## 2017-02-11 NOTE — ED Triage Notes (Signed)
Pt states her husband tripped and fell on her L ankle, which twisted her foot around last night. Pt ambulatory with limp.

## 2017-02-20 ENCOUNTER — Ambulatory Visit (INDEPENDENT_AMBULATORY_CARE_PROVIDER_SITE_OTHER): Payer: Medicaid Other

## 2017-02-20 ENCOUNTER — Ambulatory Visit (INDEPENDENT_AMBULATORY_CARE_PROVIDER_SITE_OTHER): Payer: Medicaid Other | Admitting: Podiatry

## 2017-02-20 DIAGNOSIS — S99912A Unspecified injury of left ankle, initial encounter: Secondary | ICD-10-CM

## 2017-02-20 DIAGNOSIS — M779 Enthesopathy, unspecified: Secondary | ICD-10-CM

## 2017-02-20 MED ORDER — TRIAMCINOLONE ACETONIDE 10 MG/ML IJ SUSP
10.0000 mg | Freq: Once | INTRAMUSCULAR | Status: AC
  Administered 2017-02-20: 10 mg

## 2017-02-22 NOTE — Progress Notes (Signed)
Subjective:    Patient ID: Alyssa ShellingJammie Owen, female   DOB: 31 y.o.   MRN: [id number]   HPI patient presents stating about a month ago she had an injury to her left ankle and it was painful in the joint and she states that while the swelling has gone down the joint has remained painful    Review of Systems  All other systems reviewed and are negative.       Objective:  Physical Exam  Cardiovascular: Intact distal pulses.   Musculoskeletal: Normal range of motion.  Neurological: She is alert.  Nursing note and vitals reviewed.  neurovascular status found to be intact muscle strength was adequate range of motion subtalar midtarsal joint adequate with mild restriction ankle joint left for she is splinting due to pain. The edema is mild with not significant amounts noted and there is quite a bit of discomfort in the sinus tarsi left with fluid buildup and patient's found have good digital perfusion and is well oriented     Assessment:   Probable sinus tarsitis which may be due to the ankle sprain that she sustained      Plan:    H&P condition reviewed and careful injection of the sinus tarsi administered left 3 Milligan Kenalog 5 g Xylocaine along with advice on compression ice therapy reduced activity and stable shoes. If symptoms persist will be seen back  X-rays indicate that there is no indications of fracture or diastases injury

## 2017-04-20 ENCOUNTER — Other Ambulatory Visit: Payer: Self-pay | Admitting: Family Medicine

## 2017-04-20 DIAGNOSIS — S93402S Sprain of unspecified ligament of left ankle, sequela: Secondary | ICD-10-CM

## 2017-04-21 ENCOUNTER — Encounter (HOSPITAL_COMMUNITY): Payer: Self-pay | Admitting: Emergency Medicine

## 2017-04-21 DIAGNOSIS — R531 Weakness: Secondary | ICD-10-CM | POA: Diagnosis not present

## 2017-04-21 DIAGNOSIS — M5441 Lumbago with sciatica, right side: Secondary | ICD-10-CM | POA: Diagnosis not present

## 2017-04-21 DIAGNOSIS — J45909 Unspecified asthma, uncomplicated: Secondary | ICD-10-CM | POA: Insufficient documentation

## 2017-04-21 DIAGNOSIS — M5442 Lumbago with sciatica, left side: Secondary | ICD-10-CM | POA: Insufficient documentation

## 2017-04-21 DIAGNOSIS — R200 Anesthesia of skin: Secondary | ICD-10-CM | POA: Insufficient documentation

## 2017-04-21 DIAGNOSIS — Z79899 Other long term (current) drug therapy: Secondary | ICD-10-CM | POA: Diagnosis not present

## 2017-04-21 DIAGNOSIS — M545 Low back pain: Secondary | ICD-10-CM | POA: Diagnosis present

## 2017-04-21 DIAGNOSIS — Z791 Long term (current) use of non-steroidal anti-inflammatories (NSAID): Secondary | ICD-10-CM | POA: Diagnosis not present

## 2017-04-21 MED ORDER — ONDANSETRON 4 MG PO TBDP
4.0000 mg | ORAL_TABLET | Freq: Once | ORAL | Status: AC
  Administered 2017-04-21: 4 mg via ORAL

## 2017-04-21 MED ORDER — ONDANSETRON 4 MG PO TBDP
ORAL_TABLET | ORAL | Status: AC
  Filled 2017-04-21: qty 1

## 2017-04-21 MED ORDER — OXYCODONE-ACETAMINOPHEN 5-325 MG PO TABS
ORAL_TABLET | ORAL | Status: AC
  Administered 2017-04-21: 1 via ORAL
  Filled 2017-04-21: qty 1

## 2017-04-21 MED ORDER — OXYCODONE-ACETAMINOPHEN 5-325 MG PO TABS
ORAL_TABLET | ORAL | Status: AC
  Filled 2017-04-21: qty 1

## 2017-04-21 MED ORDER — OXYCODONE-ACETAMINOPHEN 5-325 MG PO TABS
1.0000 | ORAL_TABLET | ORAL | Status: AC | PRN
  Administered 2017-04-21 (×2): 1 via ORAL

## 2017-04-21 NOTE — ED Triage Notes (Signed)
Pt presents with lower back pain since Sunday, no known injury; pt states [facility] thinking it was a muscle strain, pt now with BLE weakness, numbness on right worse than left

## 2017-04-21 NOTE — ED Notes (Signed)
Pt sent an iconnect message stating that her pain was not any better. Informed her that I would get her more pain medication.

## 2017-04-21 NOTE — ED Notes (Addendum)
Pt had sent iconnect message stating that her pain was bad.  When nurse first saw the message the triage nurse had already medicated the patient.  I went and spoke with pt and told her that I saw her message and that the triage nurse had medicated her.  Told her to let us know if she needed anything.

## 2017-04-22 ENCOUNTER — Emergency Department (HOSPITAL_COMMUNITY)
Admission: EM | Admit: 2017-04-22 | Discharge: 2017-04-22 | Disposition: A | Discharge status: Home / Self Care | Payer: Medicaid Other | Attending: Emergency Medicine | Admitting: Emergency Medicine

## 2017-04-22 ENCOUNTER — Emergency Department (HOSPITAL_COMMUNITY): Payer: Medicaid Other

## 2017-04-22 DIAGNOSIS — M5441 Lumbago with sciatica, right side: Secondary | ICD-10-CM

## 2017-04-22 DIAGNOSIS — M5442 Lumbago with sciatica, left side: Secondary | ICD-10-CM

## 2017-04-22 LAB — I-STAT ARTERIAL BLOOD GAS, ED
Acid-base deficit: 3 mmol/L — ABNORMAL HIGH (ref 0.0–2.0)
BICARBONATE: 22.1 mmol/L (ref 20.0–28.0)
O2 Saturation: 95 %
PCO2 ART: 38.6 mmHg (ref 32.0–48.0)
PO2 ART: 79 mmHg — AB (ref 83.0–108.0)
Patient temperature: 98.6
TCO2: 23 mmol/L (ref 0–100)
pH, Arterial: 7.365 (ref 7.350–7.450)

## 2017-04-22 LAB — BASIC METABOLIC PANEL
ANION GAP: 6 (ref 5–15)
BUN: 9 mg/dL (ref 6–20)
CALCIUM: 8.9 mg/dL (ref 8.9–10.3)
CO2: 24 mmol/L (ref 22–32)
Chloride: 103 mmol/L (ref 101–111)
Creatinine, Ser: 0.75 mg/dL (ref 0.44–1.00)
Glucose, Bld: 92 mg/dL (ref 65–99)
Potassium: 3.7 mmol/L (ref 3.5–5.1)
Sodium: 133 mmol/L — ABNORMAL LOW (ref 135–145)

## 2017-04-22 LAB — CBC WITH DIFFERENTIAL/PLATELET
BASOS ABS: 0 10*3/uL (ref 0.0–0.1)
Basophils Relative: 0 %
Eosinophils Absolute: 0 10*3/uL (ref 0.0–0.7)
Eosinophils Relative: 0 %
HEMATOCRIT: 34.2 % — AB (ref 36.0–46.0)
HEMOGLOBIN: 11.8 g/dL — AB (ref 12.0–15.0)
Lymphocytes Relative: 31 %
Lymphs Abs: 3.2 10*3/uL (ref 0.7–4.0)
MCH: 29.1 pg (ref 26.0–34.0)
MCHC: 34.5 g/dL (ref 30.0–36.0)
MCV: 84.2 fL (ref 78.0–100.0)
MONOS PCT: 5 %
Monocytes Absolute: 0.6 10*3/uL (ref 0.1–1.0)
NEUTROS ABS: 6.5 10*3/uL (ref 1.7–7.7)
NEUTROS PCT: 64 %
Platelets: 196 10*3/uL (ref 150–400)
RBC: 4.06 MIL/uL (ref 3.87–5.11)
RDW: 13.3 % (ref 11.5–15.5)
WBC: 10.3 10*3/uL (ref 4.0–10.5)

## 2017-04-22 LAB — BRAIN NATRIURETIC PEPTIDE: B Natriuretic Peptide: 10 pg/mL (ref 0.0–100.0)

## 2017-04-22 MED ORDER — NAPROXEN 500 MG PO TABS: 500.0000 mg | ORAL_TABLET | Freq: Two times a day (BID) | ORAL | 0 refills | Status: DC

## 2017-04-22 MED ORDER — MORPHINE SULFATE (PF) 4 MG/ML IV SOLN
8.0000 mg | Freq: Once | INTRAVENOUS | Status: AC
  Administered 2017-04-22: 8 mg via INTRAVENOUS
  Filled 2017-04-22: qty 2

## 2017-04-22 MED ORDER — CYCLOBENZAPRINE HCL 10 MG PO TABS: 10.0000 mg | ORAL_TABLET | Freq: Three times a day (TID) | ORAL | 0 refills | Status: DC | PRN

## 2017-04-22 MED ORDER — HYDROCODONE-ACETAMINOPHEN 5-325 MG PO TABS: 1.0000 | ORAL_TABLET | Freq: Four times a day (QID) | ORAL | 0 refills | Status: DC | PRN

## 2017-04-22 MED ORDER — ONDANSETRON HCL 4 MG/2ML IJ SOLN
4.0000 mg | Freq: Once | INTRAMUSCULAR | Status: AC
  Administered 2017-04-22: 4 mg via INTRAVENOUS
  Filled 2017-04-22: qty 2

## 2017-04-22 NOTE — ED Notes (Signed)
Pt understood dc material. NAD noted. Scripts given at dc 

## 2017-04-22 NOTE — ED Provider Notes (Addendum)
[facility]-EMERGENCY DEPT Provider Note   CSN: [id number] Arrival date & time: 04/21/17  2102 By signing my name below, I, Levon Hedger, attest that this documentation has been prepared under the direction and in the presence of Pricilla Loveless, MD . Electronically Signed: Levon Hedger, Scribe. 04/22/2017. 1:17 AM.   History   Chief Complaint Chief Complaint  Patient presents with  . Back Pain  . Numbness  . Weakness   HPI Alyssa Owen is a 31 y.o. female who presents to the Emergency Department complaining of constant, gradually worsening, severe lower back pain onset five days ago. Back pain is exacerbated by direct palpation and movement. No alleviating factors noted. Pt went to [facility] today with no relief of pain. Pt reports associated numbness to her bilateral toes and anterior thighs, BLE weakness, and BLE pain which began today after her massage. Pt also states her legs "buckled" on her while trying to ambulate earlier today. She denies any abdominal pain, dysuria, hematuria, incontinence of urine or stool, or saddle anesthesia.  No recent injury or trauma.   The history is provided by the patient. No language interpreter was used.    Past Medical History:  Diagnosis Date  . Asthma   . IBS (irritable bowel syndrome)   . Migraine     Patient Active Problem List   Diagnosis Date Noted  . Nausea with vomiting 02/04/2015  . Asthma   . Migraine     Past Surgical History:  Procedure Laterality Date  . ABDOMINAL HYSTERECTOMY    . BREAST LUMPECTOMY    . CHOLECYSTECTOMY    . KNEE SURGERY    . ROTATOR CUFF REPAIR    . TUBAL LIGATION    . uterine ablation      OB History    No data available       Home Medications    Prior to Admission medications   Medication Sig Start Date End Date Taking? Authorizing Provider  albuterol (PROVENTIL HFA;VENTOLIN HFA) 108 (90 Base) MCG/ACT inhaler Inhale 1-2 puffs into the lungs every 6 (six) hours as needed for wheezing or  shortness of breath.   Yes [provider]  aspirin-acetaminophen-caffeine (EXCEDRIN MIGRAINE) [phone] MG tablet Take 1 tablet by mouth every 6 (six) hours as needed for headache.   Yes [provider]  ibuprofen (ADVIL,MOTRIN) 800 MG tablet Take 800 mg by mouth every 8 (eight) hours as needed for headache or moderate pain.    Yes [provider]  mometasone-formoterol (DULERA) 200-5 MCG/ACT AERO Inhale 2 puffs into the lungs 2 (two) times daily.   Yes [provider]  rizatriptan (MAXALT-MLT) 10 MG disintegrating tablet Take 1 tablet (10 mg total) by mouth as needed for migraine. May repeat in 2 hours if needed 02/04/15  Yes Nilda Riggs, NP  cyclobenzaprine (FLEXERIL) 10 MG tablet Take 1 tablet (10 mg total) by mouth 3 (three) times daily as needed for muscle spasms. 04/22/17   Pricilla Loveless, MD  HYDROcodone-acetaminophen (NORCO/VICODIN) 5-325 MG tablet Take 1 tablet by mouth every 6 (six) hours as needed for severe pain. 04/22/17   Pricilla Loveless, MD  meloxicam (MOBIC) 15 MG tablet Take 1 tablet (15 mg total) by mouth daily. Take 1 daily with food. Patient not taking: Reported on 04/22/2017 02/11/17   Arthor Captain, PA-C  naproxen (NAPROSYN) 500 MG tablet Take 1 tablet (500 mg total) by mouth 2 (two) times daily with a meal. 04/22/17   Pricilla Loveless, MD  nortriptyline (PAMELOR) 50 MG capsule  Take 2 capsules (100 mg total) by mouth at bedtime. Patient not taking: Reported on 02/20/2017 02/04/15   Nilda Riggs, NP  topiramate (TOPAMAX) 50 MG tablet Take 5 tablets (250 mg total) by mouth daily. Patient not taking: Reported on 02/20/2017 02/04/15   Nilda Riggs, NP    Family History History reviewed. No pertinent family history.  Social History Social History  Substance Use Topics  . Smoking status: Never Smoker  . Smokeless tobacco: Never Used  . Alcohol use 0.0 oz/week     Comment: rarely    Allergies   Bee pollen; Other;  Tomato; Cephalosporins; Codeine; Oxycodone; and Prednisone   Review of Systems Review of Systems  Gastrointestinal: Negative for abdominal pain.  Genitourinary: Negative for dysuria and hematuria.  Musculoskeletal: Positive for back pain and myalgias.  Neurological: Positive for weakness and numbness.  All other systems reviewed and are negative.   Physical Exam Updated Vital Signs BP (!) 107/58   Pulse 73   Temp 98.6 F (37 C) (Oral)   Resp 18   LMP 11/12/2014   SpO2 98%   Physical Exam  Constitutional: She is oriented to person, place, and time. She appears well-developed and well-nourished.  HENT:  Head: Normocephalic and atraumatic.  Right Ear: External ear normal.  Left Ear: External ear normal.  Nose: Nose normal.  Eyes: Right eye exhibits no discharge. Left eye exhibits no discharge.  Cardiovascular: Normal rate, regular rhythm and normal heart sounds.   Pulmonary/Chest: Effort normal and breath sounds normal.  Abdominal: Soft. There is no tenderness.  Musculoskeletal:  Diffuse lumbar spinal and paraspinal tenderness. No step-offs or deformities.   Neurological: She is alert and oriented to person, place, and time.  Reflex Scores:      Patellar reflexes are 2+ on the right side and 2+ on the left side.      Achilles reflexes are 2+ on the right side and 2+ on the left side. Able to lift both leg off of the stretcher, but they appear weak, unclear if this is from pain or neurologic weakness. Slight decreased subjective sensation to inner left lower leg.   Skin: Skin is warm and dry.  Nursing note and vitals reviewed.  ED Treatments / Results  DIAGNOSTIC STUDIES:  Oxygen Saturation is 100% on RA, normal by my interpretation.    COORDINATION OF CARE:  1:16 AM Will order MR lumbar spine. Discussed treatment plan with pt at bedside and pt agreed to plan.  Labs (all labs ordered are listed, but only abnormal results are displayed) Labs Reviewed  BASIC METABOLIC  PANEL - Abnormal; Notable for the following:       Result Value   Sodium 133 (*)    All other components within normal limits  CBC WITH DIFFERENTIAL/PLATELET - Abnormal; Notable for the following:    Hemoglobin 11.8 (*)    HCT 34.2 (*)    All other components within normal limits  I-STAT ARTERIAL BLOOD GAS, ED - Abnormal; Notable for the following:    pO2, Arterial 79.0 (*)    Acid-base deficit 3.0 (*)    All other components within normal limits  BRAIN NATRIURETIC PEPTIDE    EKG  EKG Interpretation None       Radiology Mr Lumbar Spine Wo Contrast  Result Date: 04/22/2017 CLINICAL DATA:  Gradually worsening, severe low back pain for 5 days. EXAM: MRI LUMBAR SPINE WITHOUT CONTRAST TECHNIQUE: Multiplanar, multisequence MR imaging of the lumbar spine was performed. No intravenous contrast  was administered. COMPARISON:  Lumbar spine radiographs June 28, 2016. MRI of the lumbar spine report dated September 11, 2012 though images are not available for direct comparison. FINDINGS: ALIGNMENT: Maintenance of the thoracic kyphosis. No malalignment. VERTEBRAE/DISCS: Vertebral bodies are intact. Mild L5-S1 disc height loss and desiccation. No abnormal or acute bone marrow signal. Mild chronic discogenic endplate changes L5-S1. CORD: Thoracic spinal cord is normal morphology and signal characteristics to the level of the conus medullaris which terminates at T12-L1. PREVERTEBRAL AND PARASPINAL SOFT TISSUES:  Normal. DISC LEVELS: L1-2 thru L4-5: No disc bulge, canal stenosis nor neural foraminal narrowing. L5-S1: Small central disc protrusion. No canal stenosis or neural foraminal narrowing. IMPRESSION: 1. Small L5-S1 disc protrusion without canal stenosis or neural foraminal narrowing. 2. No fracture or malalignment. Electronically Signed   By: Awilda Metroourtnay  Bloomer M.D.   On: 04/22/2017 05:43    Procedures Procedures (including critical care time)  Medications Ordered in ED Medications    oxyCODONE-acetaminophen (PERCOCET/ROXICET) 5-325 MG per tablet 1 tablet (1 tablet Oral Given 04/21/17 2213)  ondansetron (ZOFRAN-ODT) disintegrating tablet 4 mg (4 mg Oral Given 04/21/17 2130)  morphine 4 MG/ML injection 8 mg (8 mg Intravenous Given 04/22/17 0153)  ondansetron (ZOFRAN) injection 4 mg (4 mg Intravenous Given 04/22/17 0153)     Initial Impression / Assessment and Plan / ED Course  I have reviewed the triage vital signs and the nursing notes.  Pertinent labs & imaging results that were available during my care of the patient were reviewed by me and considered in my medical decision making (see chart for details).     Given weakness, MRI obtained rule out cauda equina. This MRI is essentially unremarkable with a mild central disc protrusion without cord abnormality. She is feeling better after IV pain medication. Her "weakness" was most likely pain related rather than true weakness. She is ambulating to the bathroom normally. This point this appears to be otherwise unremarkable musculoskeletal back pain. Will be given pain control and encouraged to follow-up closely with her PCP. Has had a prior hysterectomy. No urinary symptoms. No abdominal pain or distention. Discussed return precautions.   Of note, BNP and ABG were supposed to be ordered on another patient and were accidentally ordered on her. No concern for dyspnea/airway.  Final Clinical Impressions(s) / ED Diagnoses   Final diagnoses:  Acute bilateral low back pain with bilateral sciatica    New Prescriptions Discharge Medication List as of 04/22/2017  6:15 AM    START taking these medications   Details  cyclobenzaprine (FLEXERIL) 10 MG tablet Take 1 tablet (10 mg total) by mouth 3 (three) times daily as needed for muscle spasms., Starting Sat 04/22/2017, Print    HYDROcodone-acetaminophen (NORCO/VICODIN) 5-325 MG tablet Take 1 tablet by mouth every 6 (six) hours as needed for severe pain., Starting Sat 04/22/2017, Print     naproxen (NAPROSYN) 500 MG tablet Take 1 tablet (500 mg total) by mouth 2 (two) times daily with a meal., Starting Sat 04/22/2017, Print       I personally performed the services described in this documentation, which was scribed in my presence. The recorded information has been reviewed and is accurate.    Pricilla LovelessGoldston, Averill Winters, MD 04/22/17 [phone]    Pricilla LovelessGoldston, Sindhu Nguyen, MD 04/22/17 [phone]

## 2017-04-30 ENCOUNTER — Other Ambulatory Visit: Payer: Medicaid Other

## 2017-07-13 ENCOUNTER — Emergency Department (HOSPITAL_BASED_OUTPATIENT_CLINIC_OR_DEPARTMENT_OTHER)
Admission: EM | Admit: 2017-07-13 | Discharge: 2017-07-13 | Disposition: A | Discharge status: Home / Self Care | Payer: Medicaid Other | Attending: Physician Assistant | Admitting: Physician Assistant

## 2017-07-13 ENCOUNTER — Encounter (HOSPITAL_BASED_OUTPATIENT_CLINIC_OR_DEPARTMENT_OTHER): Payer: Self-pay | Admitting: Emergency Medicine

## 2017-07-13 DIAGNOSIS — Z79899 Other long term (current) drug therapy: Secondary | ICD-10-CM | POA: Insufficient documentation

## 2017-07-13 DIAGNOSIS — J45909 Unspecified asthma, uncomplicated: Secondary | ICD-10-CM | POA: Diagnosis not present

## 2017-07-13 DIAGNOSIS — G43001 Migraine without aura, not intractable, with status migrainosus: Secondary | ICD-10-CM

## 2017-07-13 DIAGNOSIS — R51 Headache: Secondary | ICD-10-CM | POA: Diagnosis present

## 2017-07-13 MED ORDER — SODIUM CHLORIDE 0.9 % IV BOLUS (SEPSIS)
1000.0000 mL | Freq: Once | INTRAVENOUS | Status: AC
  Administered 2017-07-13: 1000 mL via INTRAVENOUS

## 2017-07-13 MED ORDER — PROCHLORPERAZINE EDISYLATE 5 MG/ML IJ SOLN
10.0000 mg | Freq: Once | INTRAMUSCULAR | Status: AC
  Administered 2017-07-13: 10 mg via INTRAVENOUS
  Filled 2017-07-13: qty 2

## 2017-07-13 MED ORDER — DIPHENHYDRAMINE HCL 50 MG/ML IJ SOLN
25.0000 mg | Freq: Once | INTRAMUSCULAR | Status: AC
  Administered 2017-07-13: 25 mg via INTRAVENOUS
  Filled 2017-07-13: qty 1

## 2017-07-13 MED ORDER — DEXAMETHASONE SODIUM PHOSPHATE 10 MG/ML IJ SOLN
10.0000 mg | Freq: Once | INTRAMUSCULAR | Status: AC
  Administered 2017-07-13: 10 mg via INTRAVENOUS
  Filled 2017-07-13: qty 1

## 2017-07-13 MED ORDER — KETOROLAC TROMETHAMINE 30 MG/ML IJ SOLN
30.0000 mg | Freq: Once | INTRAMUSCULAR | Status: AC
  Administered 2017-07-13: 30 mg via INTRAVENOUS
  Filled 2017-07-13: qty 1

## 2017-07-13 NOTE — Discharge Instructions (Signed)
Please return with any concerns. °

## 2017-07-13 NOTE — ED Provider Notes (Signed)
[facility]   CSN: [id number] Arrival date & time: 07/13/17  1855     History   Chief Complaint Chief Complaint  Patient presents with  . Headache    HPI Alyssa Owen is a 31 y.o. female.  HPI   Patient's 31 year old female presenting with migraine. Patient has history of migraine. Patient's already taken Maxalt, 2. She's taken Excedrin Migraine. She took IM phenegran and toradol yesterday   Patient has history of migraines, has been seen by [facility] neurology now goes to headache clinic in [facility].        Past Medical History:  Diagnosis Date  . Asthma   . IBS (irritable bowel syndrome)   . Migraine     Patient Active Problem List   Diagnosis Date Noted  . Nausea with vomiting 02/04/2015  . Asthma   . Migraine     Past Surgical History:  Procedure Laterality Date  . ABDOMINAL HYSTERECTOMY    . BREAST LUMPECTOMY    . CHOLECYSTECTOMY    . KNEE SURGERY    . ROTATOR CUFF REPAIR    . TUBAL LIGATION    . uterine ablation      OB History    No data available       Home Medications    Prior to Admission medications   Medication Sig Start Date End Date Taking? Authorizing Provider  albuterol (PROVENTIL HFA;VENTOLIN HFA) 108 (90 Base) MCG/ACT inhaler Inhale 1-2 puffs into the lungs every 6 (six) hours as needed for wheezing or shortness of breath.    [provider]  aspirin-acetaminophen-caffeine (EXCEDRIN MIGRAINE) [id number] MG tablet Take 1 tablet by mouth every 6 (six) hours as needed for headache.    [provider]  cyclobenzaprine (FLEXERIL) 10 MG tablet Take 1 tablet (10 mg total) by mouth 3 (three) times daily as needed for muscle spasms. 04/22/17   Pricilla Loveless, MD  HYDROcodone-acetaminophen (NORCO/VICODIN) 5-325 MG tablet Take 1 tablet by mouth every 6 (six) hours as needed for severe pain. 04/22/17   Pricilla Loveless, MD  ibuprofen (ADVIL,MOTRIN) 800 MG tablet Take 800 mg by mouth every 8 (eight)  hours as needed for headache or moderate pain.     [provider]  meloxicam (MOBIC) 15 MG tablet Take 1 tablet (15 mg total) by mouth daily. Take 1 daily with food. Patient not taking: Reported on 04/22/2017 02/11/17   Arthor Captain, PA-C  mometasone-formoterol (DULERA) 200-5 MCG/ACT AERO Inhale 2 puffs into the lungs 2 (two) times daily.    [provider]  naproxen (NAPROSYN) 500 MG tablet Take 1 tablet (500 mg total) by mouth 2 (two) times daily with a meal. 04/22/17   Pricilla Loveless, MD  nortriptyline (PAMELOR) 50 MG capsule Take 2 capsules (100 mg total) by mouth at bedtime. Patient not taking: Reported on 02/20/2017 02/04/15   Nilda Riggs, NP  rizatriptan (MAXALT-MLT) 10 MG disintegrating tablet Take 1 tablet (10 mg total) by mouth as needed for migraine. May repeat in 2 hours if needed 02/04/15   Nilda Riggs, NP  topiramate (TOPAMAX) 50 MG tablet Take 5 tablets (250 mg total) by mouth daily. Patient not taking: Reported on 02/20/2017 02/04/15   Nilda Riggs, NP    Family History No family history on file.  Social History Social History  Substance Use Topics  . Smoking status: Never Smoker  . Smokeless tobacco: Never Used  . Alcohol use 0.0 oz/week     Comment: rarely  Allergies   Bee pollen; Other; Tomato; Cephalosporins; Codeine; Oxycodone; and Prednisone   Review of Systems Review of Systems  Constitutional: Negative for activity change.  Respiratory: Negative for shortness of breath.   Cardiovascular: Negative for chest pain.  Gastrointestinal: Negative for abdominal pain.  Neurological: Positive for dizziness and headaches.     Physical Exam Updated Vital Signs BP (!) 110/48   Pulse 66   Temp 98.1 F (36.7 C) (Oral)   Resp 18   Ht  (1.753 m)   Wt 119.7 kg (264 lb)   LMP 11/12/2014   SpO2 100%   BMI 38.99 kg/m   Physical Exam  Constitutional: She is oriented to person, place, and time. She appears  well-developed and well-nourished.  HENT:  Head: Normocephalic and atraumatic.  Eyes: Right eye exhibits no discharge.  Cardiovascular: Normal rate, regular rhythm and normal heart sounds.   No murmur heard. Pulmonary/Chest: Effort normal and breath sounds normal. She has no wheezes. She has no rales.  Abdominal: Soft. She exhibits no distension. There is no tenderness.  Neurological: She is oriented to person, place, and time.  Patient sheilding eyes from light. Moving all 4 extreities.  Skin: Skin is warm and dry. She is not diaphoretic.  Psychiatric: She has a normal mood and affect.  Nursing note and vitals reviewed.    ED Treatments / Results  Labs (all labs ordered are listed, but only abnormal results are displayed) Labs Reviewed - No data to display  EKG  EKG Interpretation None       Radiology No results found.  Procedures Procedures (including critical care time)  Medications Ordered in ED Medications  sodium chloride 0.9 % bolus 1,000 mL (not administered)  prochlorperazine (COMPAZINE) injection 10 mg (not administered)  diphenhydrAMINE (BENADRYL) injection 25 mg (not administered)  ketorolac (TORADOL) 30 MG/ML injection 30 mg (not administered)  dexamethasone (DECADRON) injection 10 mg (not administered)     Initial Impression / Assessment and Plan / ED Course  I have reviewed the triage vital signs and the nursing notes.  Pertinent labs & imaging results that were available during my care of the patient were reviewed by me and considered in my medical decision making (see chart for details).    Patient is a 31 year old female with history of migraines presenting with the same. This is her usual migraine. We'll treat with migraine cocktail.  11:11 PM Patient completely improved.   Final Clinical Impressions(s) / ED Diagnoses   Final diagnoses:  None    New Prescriptions New Prescriptions   No medications on file     Abelino Derrick,  MD 07/13/17 2311

## 2017-07-13 NOTE — ED Notes (Signed)
Migraine x 3 days.

## 2017-07-13 NOTE — ED Triage Notes (Signed)
Migraine x 3 days, hx of migraines and maxalt is not working. Was seen at dr office yesterday and received a shot of toradol and phenergan without relief. Endorses N/V and photophobia.

## 2017-07-13 NOTE — ED Notes (Signed)
ED Provider at bedside. 

## 2017-10-13 ENCOUNTER — Ambulatory Visit (HOSPITAL_COMMUNITY)
Admission: EM | Admit: 2017-10-13 | Discharge: 2017-10-13 | Disposition: A | Discharge status: Home / Self Care | Payer: Medicaid Other

## 2017-10-13 ENCOUNTER — Encounter (HOSPITAL_COMMUNITY): Payer: Self-pay | Admitting: Emergency Medicine

## 2017-10-13 DIAGNOSIS — R69 Illness, unspecified: Secondary | ICD-10-CM

## 2017-10-13 DIAGNOSIS — J111 Influenza due to unidentified influenza virus with other respiratory manifestations: Secondary | ICD-10-CM

## 2017-10-13 MED ORDER — ONDANSETRON HCL 4 MG PO TABS: 4.0000 mg | ORAL_TABLET | Freq: Four times a day (QID) | ORAL | 0 refills | Status: AC

## 2017-10-13 MED ORDER — OSELTAMIVIR PHOSPHATE 75 MG PO CAPS: 75.0000 mg | ORAL_CAPSULE | Freq: Two times a day (BID) | ORAL | 0 refills | Status: AC

## 2017-10-13 NOTE — ED Triage Notes (Signed)
PT reports chills, fever, body aches, headache, cough that started last night. PT took tylenol at 10am

## 2017-10-13 NOTE — ED Provider Notes (Signed)
[facility]    CSN: [id number] Arrival date & time: 10/13/17  1029     History   Chief Complaint Chief Complaint  Patient presents with  . Influenza    HPI Alyssa Owen is a 32 y.o. female Patient is presenting with URI symptoms- congestion, cough, sore throat. Also having body aches, fever, chills, headache. Fever of 101.2 this morning, took tylenol prior to arrival. Patient's main complaints are chills and body aches. Symptoms have been going on for 1 day. Patient has tried Tylenol for fever. Denies vomiting, diarrhea. Mild nausea. Denies shortness of breath and chest pain.    HPI  Past Medical History:  Diagnosis Date  . Asthma   . IBS (irritable bowel syndrome)   . Migraine     Patient Active Problem List   Diagnosis Date Noted  . Nausea with vomiting 02/04/2015  . Asthma   . Migraine     Past Surgical History:  Procedure Laterality Date  . ABDOMINAL HYSTERECTOMY    . BREAST LUMPECTOMY    . CHOLECYSTECTOMY    . KNEE SURGERY    . ROTATOR CUFF REPAIR    . TUBAL LIGATION    . uterine ablation      OB History    No data available       Home Medications    Prior to Admission medications   Medication Sig Start Date End Date Taking? Authorizing Provider  aspirin-acetaminophen-caffeine (EXCEDRIN MIGRAINE) [phone] MG tablet Take 1 tablet by mouth every 6 (six) hours as needed for headache.   Yes [provider]  busPIRone (BUSPAR) 7.5 MG tablet Take 15 mg by mouth 3 (three) times daily.   Yes [provider]  citalopram (CELEXA) 10 MG tablet Take 10 mg by mouth daily.   Yes [provider]  mometasone-formoterol (DULERA) 200-5 MCG/ACT AERO Inhale 2 puffs into the lungs 2 (two) times daily.   Yes [provider]  rizatriptan (MAXALT-MLT) 10 MG disintegrating tablet Take 1 tablet (10 mg total) by mouth as needed for migraine. May repeat in 2 hours if needed 02/04/15  Yes Daphine Deutscher, Dale Ringsted, NP  albuterol  (PROVENTIL HFA;VENTOLIN HFA) 108 (90 Base) MCG/ACT inhaler Inhale 1-2 puffs into the lungs every 6 (six) hours as needed for wheezing or shortness of breath.    [provider]  cyclobenzaprine (FLEXERIL) 10 MG tablet Take 1 tablet (10 mg total) by mouth 3 (three) times daily as needed for muscle spasms. 04/22/17   Pricilla Loveless, MD  HYDROcodone-acetaminophen (NORCO/VICODIN) 5-325 MG tablet Take 1 tablet by mouth every 6 (six) hours as needed for severe pain. 04/22/17   Pricilla Loveless, MD  ibuprofen (ADVIL,MOTRIN) 800 MG tablet Take 800 mg by mouth every 8 (eight) hours as needed for headache or moderate pain.     [provider]  naproxen (NAPROSYN) 500 MG tablet Take 1 tablet (500 mg total) by mouth 2 (two) times daily with a meal. 04/22/17   Pricilla Loveless, MD  ondansetron (ZOFRAN) 4 MG tablet Take 1 tablet (4 mg total) by mouth every 6 (six) hours for 4 days. 10/13/17 10/17/17  Wieters, Hallie C, PA-C  oseltamivir (TAMIFLU) 75 MG capsule Take 1 capsule (75 mg total) by mouth every 12 (twelve) hours for 5 days. 10/13/17 10/18/17  Wieters, Junius Creamer, PA-C    Family History History reviewed. No pertinent family history.  Social History Social History   Tobacco Use  . Smoking status: Never Smoker  . Smokeless tobacco: Never Used  Substance Use Topics  . Alcohol use: Yes    Alcohol/week: 0.0 oz    Comment: rarely  . Drug use: No     Allergies   Bee pollen; Other; Tomato; Cephalosporins; Codeine; Oxycodone; and Prednisone   Review of Systems Review of Systems  Constitutional: Positive for chills, fatigue and fever.  HENT: Positive for congestion and sore throat. Negative for ear pain, rhinorrhea, sinus pressure and trouble swallowing.   Respiratory: Positive for cough. Negative for chest tightness and shortness of breath.   Cardiovascular: Negative for chest pain.  Gastrointestinal: Positive for nausea. Negative for abdominal pain and vomiting.  Musculoskeletal: Positive  for myalgias.  Skin: Negative for rash.  Neurological: Positive for headaches. Negative for dizziness and light-headedness.     Physical Exam Triage Vital Signs ED Triage Vitals  Enc Vitals Group     BP 10/13/17 1213 109/70     Pulse Rate 10/13/17 1212 63     Resp 10/13/17 1212 16     Temp 10/13/17 1212 98.6 F (37 C)     Temp Source 10/13/17 1212 Oral     SpO2 10/13/17 1212 99 %     Weight 10/13/17 1210 232 lb (105.2 kg)     Height 10/13/17 1210 5\' 9"  (1.753 m)     Head Circumference --      Peak Flow --      Pain Score 10/13/17 1210 8     Pain Loc --      Pain Edu? --      Excl. in GC? --    No data found.  Updated Vital Signs BP 109/70   Pulse 63   Temp 98.6 F (37 C) (Oral)   Resp 16   Ht 5\' 9"  (1.753 m)   Wt 232 lb (105.2 kg)   LMP 11/12/2014   SpO2 99%   BMI 34.26 kg/m    Physical Exam  Constitutional: She appears well-developed and well-nourished. No distress.  HENT:  Head: Normocephalic and atraumatic.  Right Ear: Tympanic membrane and ear canal normal.  Left Ear: Tympanic membrane and ear canal normal.  Nose: Nose normal.  Mouth/Throat: Uvula is midline and mucous membranes are normal. No oral lesions. No trismus in the jaw. No uvula swelling. Posterior oropharyngeal erythema present. No oropharyngeal exudate. No tonsillar exudate.  Eyes: Conjunctivae are normal.  Neck: Normal range of motion. Neck supple.  Cardiovascular: Normal rate and regular rhythm.  No murmur heard. Pulmonary/Chest: Effort normal and breath sounds normal. No respiratory distress. She has no wheezes. She has no rales.  Moving air well throughout bilateral lung fields, no adventitious sounds  Abdominal: Soft. There is no tenderness.  Musculoskeletal: She exhibits no edema.  Lymphadenopathy:    She has no cervical adenopathy.  Neurological: She is alert.  Skin: Skin is warm and dry.  Psychiatric: She has a normal mood and affect.  Nursing note and vitals reviewed.    UC  Treatments / Results  Labs (all labs ordered are listed, but only abnormal results are displayed) Labs Reviewed - No data to display  EKG  EKG Interpretation None       Radiology No results found.  Procedures Procedures (including critical care time)  Medications Ordered in UC Medications - No data to display   Initial Impression / Assessment and Plan / UC Course  I have reviewed the triage vital signs and the nursing notes.  Pertinent labs & imaging results that were available during my care of the patient  were reviewed by me and considered in my medical decision making (see chart for details).     Patient presents with symptoms likely from influenza vs other viral upper respiratory infection. Differential includes bacterial pneumonia, sinusitis, allergic rhinitis, acute bronchitis. Do not suspect underlying cardiopulmonary process. Symptoms seem unlikely related to ACS, CHF or COPD exacerbations, pneumonia, pneumothorax. Patient is nontoxic appearing and not in need of emergent medical intervention.  Tamiflu and zofran prescribed.  Recommended symptom control with over the counter medications: Daily oral anti-histamine, Oral decongestant or IN corticosteroid, saline irrigations, cepacol lozenges, Robitussin, Delsym, honey tea.  Return if symptoms fail to improve in 1-2 weeks or you develop shortness of breath, chest pain, severe headache. Patient states understanding and is agreeable. Discussed strict return precautions. Patient verbalized understanding and is agreeable with plan.   Discharged with PCP followup.   Final Clinical Impressions(s) / UC Diagnoses   Final diagnoses:  Influenza-like illness    ED Discharge Orders        Ordered    oseltamivir (TAMIFLU) 75 MG capsule  Every 12 hours     10/13/17 1247    ondansetron (ZOFRAN) 4 MG tablet  Every 6 hours     10/13/17 1247       Controlled Substance Prescriptions [place] Controlled Substance Registry  consulted? Not Applicable   Lew Dawes, [place] 10/13/17 1255

## 2017-10-13 NOTE — Discharge Instructions (Signed)
We are going to treat you for the flu with Tamiflu. We recommended symptom control also. I expect your symptoms to start improving in the next 1-2 weeks. Zofran for any nausea.  1. Take a daily allergy pill/anti-histamine like Zyrtec, Claritin, or Store brand consistently for 2 weeks  2. For congestion you may try an oral decongestant like Mucinex or sudafed. You may also try intranasal flonase nasal spray or saline irrigations (neti pot, sinus cleanse)  3. For your sore throat you may try cepacol lozenges, salt water gargles, throat spray. Treatment of congestion may also help your sore throat.  4. For cough you may try Robitussen, Mucinex Dm, Delsym  5. Take Tylenol or Ibuprofen to help with fever/pain/inflammation  6. Stay hydrated, drink plenty of fluids to keep throat coated and less irritated  Honey Tea For cough/sore throat try using a honey-based tea. Use 3 teaspoons of honey with juice squeezed from half lemon. Place shaved pieces of ginger into 1/2-1 cup of water and warm over stove top. Then mix the ingredients and repeat every 4 hours as needed.

## 2017-12-10 ENCOUNTER — Emergency Department (HOSPITAL_BASED_OUTPATIENT_CLINIC_OR_DEPARTMENT_OTHER): Payer: 59

## 2017-12-10 ENCOUNTER — Emergency Department (HOSPITAL_BASED_OUTPATIENT_CLINIC_OR_DEPARTMENT_OTHER)
Admission: EM | Admit: 2017-12-10 | Discharge: 2017-12-10 | Disposition: A | Discharge status: Home / Self Care | Payer: 59 | Attending: Emergency Medicine | Admitting: Emergency Medicine

## 2017-12-10 ENCOUNTER — Other Ambulatory Visit: Payer: Self-pay

## 2017-12-10 ENCOUNTER — Encounter (HOSPITAL_BASED_OUTPATIENT_CLINIC_OR_DEPARTMENT_OTHER): Payer: Self-pay | Admitting: Emergency Medicine

## 2017-12-10 DIAGNOSIS — S20212A Contusion of left front wall of thorax, initial encounter: Secondary | ICD-10-CM | POA: Diagnosis not present

## 2017-12-10 DIAGNOSIS — S301XXA Contusion of abdominal wall, initial encounter: Secondary | ICD-10-CM | POA: Diagnosis not present

## 2017-12-10 DIAGNOSIS — Y92009 Unspecified place in unspecified non-institutional (private) residence as the place of occurrence of the external cause: Secondary | ICD-10-CM | POA: Diagnosis not present

## 2017-12-10 DIAGNOSIS — Y999 Unspecified external cause status: Secondary | ICD-10-CM | POA: Diagnosis not present

## 2017-12-10 DIAGNOSIS — Z79899 Other long term (current) drug therapy: Secondary | ICD-10-CM | POA: Diagnosis not present

## 2017-12-10 DIAGNOSIS — J45909 Unspecified asthma, uncomplicated: Secondary | ICD-10-CM | POA: Diagnosis not present

## 2017-12-10 DIAGNOSIS — Y939 Activity, unspecified: Secondary | ICD-10-CM | POA: Insufficient documentation

## 2017-12-10 DIAGNOSIS — S299XXA Unspecified injury of thorax, initial encounter: Secondary | ICD-10-CM | POA: Diagnosis present

## 2017-12-10 LAB — BASIC METABOLIC PANEL
Anion gap: 8 (ref 5–15)
BUN: 7 mg/dL (ref 6–20)
CALCIUM: 9.2 mg/dL (ref 8.9–10.3)
CO2: 24 mmol/L (ref 22–32)
CREATININE: 0.68 mg/dL (ref 0.44–1.00)
Chloride: 106 mmol/L (ref 101–111)
GFR calc Af Amer: >60 mL/min (ref >60)
GFR calc non Af Amer: >60 mL/min (ref >60)
GLUCOSE: 91 mg/dL (ref 65–99)
Potassium: 3.8 mmol/L (ref 3.5–5.1)
Sodium: 138 mmol/L (ref 135–145)

## 2017-12-10 MED ORDER — IOPAMIDOL (ISOVUE-300) INJECTION 61%
100.0000 mL | Freq: Once | INTRAVENOUS | Status: AC | PRN
  Administered 2017-12-10: 100 mL via INTRAVENOUS
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
## 2017-12-10 NOTE — Discharge Instructions (Signed)
It was my pleasure taking care of you today!   Ibuprofen as needed for pain. Ice or heat for additional pain relief.   Follow up with your primary care doctor if your symptoms persist.   Return to ER for new or worsening symptoms, any additional concerns.   COLD THERAPY DIRECTIONS:  Ice or gel packs can be used to reduce both pain and swelling. Ice is the most helpful within the first 24 to 48 hours after an injury or flareup from overusing a muscle or joint.  Ice is effective, has very few side effects, and is safe for most people to use.   If you expose your skin to cold temperatures for too long or without the proper protection, you can damage your skin or nerves. Watch for signs of skin damage due to cold.   HOME CARE INSTRUCTIONS  Follow these tips to use ice and cold packs safely.  Place a dry or damp towel between the ice and skin. A damp towel will cool the skin more quickly, so you may need to shorten the time that the ice is used.  For a more rapid response, add gentle compression to the ice.  Ice for no more than 10 to 20 minutes at a time. The bonier the area you are icing, the less time it will take to get the benefits of ice.  Check your skin after 5 minutes to make sure there are no signs of a poor response to cold or skin damage.  Rest 20 minutes or more in between uses.  Once your skin is numb, you can end your treatment. You can test numbness by very lightly touching your skin. The touch should be so light that you do not see the skin dimple from the pressure of your fingertip. When using ice, most people will feel these normal sensations in this order: cold, burning, aching, and numbness.

## 2017-12-10 NOTE — ED Triage Notes (Signed)
SANE RN called at this time. SANE RN states to call back if the patient wants to talk to her about resources.
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
## 2017-12-10 NOTE — ED Triage Notes (Signed)
[employer] called, and made report
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
## 2018-06-06 ENCOUNTER — Other Ambulatory Visit: Payer: Self-pay

## 2018-06-06 ENCOUNTER — Emergency Department (HOSPITAL_COMMUNITY): Payer: 59

## 2018-06-06 ENCOUNTER — Encounter (HOSPITAL_COMMUNITY): Payer: Self-pay | Admitting: Emergency Medicine

## 2018-06-06 ENCOUNTER — Emergency Department (HOSPITAL_COMMUNITY)
Admission: EM | Admit: 2018-06-06 | Discharge: 2018-06-06 | Disposition: A | Discharge status: Home / Self Care | Payer: 59 | Attending: Emergency Medicine | Admitting: Emergency Medicine

## 2018-06-06 DIAGNOSIS — Z79899 Other long term (current) drug therapy: Secondary | ICD-10-CM | POA: Diagnosis not present

## 2018-06-06 DIAGNOSIS — Z9049 Acquired absence of other specified parts of digestive tract: Secondary | ICD-10-CM | POA: Diagnosis not present

## 2018-06-06 DIAGNOSIS — N83201 Unspecified ovarian cyst, right side: Secondary | ICD-10-CM | POA: Insufficient documentation

## 2018-06-06 DIAGNOSIS — J45909 Unspecified asthma, uncomplicated: Secondary | ICD-10-CM | POA: Diagnosis not present

## 2018-06-06 DIAGNOSIS — R1031 Right lower quadrant pain: Secondary | ICD-10-CM

## 2018-06-06 LAB — URINALYSIS, ROUTINE W REFLEX MICROSCOPIC
Bilirubin Urine: NEGATIVE
Glucose, UA: NEGATIVE mg/dL
Hgb urine dipstick: NEGATIVE
KETONES UR: NEGATIVE mg/dL
LEUKOCYTES UA: NEGATIVE
NITRITE: NEGATIVE
PROTEIN: NEGATIVE mg/dL
Specific Gravity, Urine: 1.026 (ref 1.005–1.030)
pH: 5 (ref 5.0–8.0)

## 2018-06-06 LAB — CBC
HCT: 34.8 % — ABNORMAL LOW (ref 36.0–46.0)
Hemoglobin: 11.9 g/dL — ABNORMAL LOW (ref 12.0–15.0)
MCH: 29.6 pg (ref 26.0–34.0)
MCHC: 34.2 g/dL (ref 30.0–36.0)
MCV: 86.6 fL (ref 78.0–100.0)
PLATELETS: 182 10*3/uL (ref 150–400)
RBC: 4.02 MIL/uL (ref 3.87–5.11)
RDW: 13.1 % (ref 11.5–15.5)
WBC: 6.6 10*3/uL (ref 4.0–10.5)

## 2018-06-06 LAB — LIPASE, BLOOD: Lipase: 34 U/L (ref 11–51)

## 2018-06-06 LAB — COMPREHENSIVE METABOLIC PANEL
ALT: 12 U/L (ref 0–44)
AST: 16 U/L (ref 15–41)
Albumin: 3.9 g/dL (ref 3.5–5.0)
Alkaline Phosphatase: 43 U/L (ref 38–126)
Anion gap: 7 (ref 5–15)
BILIRUBIN TOTAL: 0.7 mg/dL (ref 0.3–1.2)
BUN: 10 mg/dL (ref 6–20)
CO2: 25 mmol/L (ref 22–32)
CREATININE: 0.79 mg/dL (ref 0.44–1.00)
Calcium: 9.2 mg/dL (ref 8.9–10.3)
Chloride: 108 mmol/L (ref 98–111)
Glucose, Bld: 91 mg/dL (ref 70–99)
Potassium: 4.2 mmol/L (ref 3.5–5.1)
Sodium: 140 mmol/L (ref 135–145)
TOTAL PROTEIN: 6.5 g/dL (ref 6.5–8.1)

## 2018-06-06 MED ORDER — IOPAMIDOL (ISOVUE-300) INJECTION 61%
100.0000 mL | Freq: Once | INTRAVENOUS | Status: AC
  Administered 2018-06-06: 100 mL via INTRAVENOUS

## 2018-06-06 MED ORDER — ACETAMINOPHEN 500 MG PO TABS
1000.0000 mg | ORAL_TABLET | Freq: Once | ORAL | Status: AC
  Administered 2018-06-06: 1000 mg via ORAL
  Filled 2018-06-06: qty 2

## 2018-06-06 MED ORDER — IOPAMIDOL (ISOVUE-300) INJECTION 61%
INTRAVENOUS | Status: AC
  Filled 2018-06-06: qty 100

## 2018-06-06 MED ORDER — IBUPROFEN 400 MG PO TABS
400.0000 mg | ORAL_TABLET | Freq: Once | ORAL | Status: AC
  Administered 2018-06-06: 400 mg via ORAL
  Filled 2018-06-06: qty 1

## 2018-06-06 NOTE — ED Provider Notes (Signed)
[facility]   CSN: [id number] Arrival date & time: 06/06/18  0801     History   Chief Complaint Chief Complaint  Patient presents with  . Abdominal Pain    HPI Saundra ShellingJammie Loken is a 32 y.o. female.  Patient c/o RLQ abd pain onset 0430 today. Pain dull, constant, moderate, worse w movements, non radiating. Nausea, decreased appetite in past day. Having normal bms. No dysuria or hematuria. Prior abd surgery includes remote hx hysterectomy and cholecystectomy.   The history is provided by the patient.  Abdominal Pain   Pertinent negatives include fever, vomiting, dysuria, hematuria and headaches.    Past Medical History:  Diagnosis Date  . Asthma   . IBS (irritable bowel syndrome)   . Migraine     Patient Active Problem List   Diagnosis Date Noted  . Nausea with vomiting 02/04/2015  . Asthma   . Migraine     Past Surgical History:  Procedure Laterality Date  . ABDOMINAL HYSTERECTOMY    . BREAST LUMPECTOMY    . CHOLECYSTECTOMY    . KNEE SURGERY    . ROTATOR CUFF REPAIR    . TUBAL LIGATION    . uterine ablation       OB History   None      Home Medications    Prior to Admission medications   Medication Sig Start Date End Date Taking? Authorizing Provider  albuterol (PROVENTIL HFA;VENTOLIN HFA) 108 (90 Base) MCG/ACT inhaler Inhale 1-2 puffs into the lungs every 6 (six) hours as needed for wheezing or shortness of breath.    [provider]  aspirin-acetaminophen-caffeine (EXCEDRIN MIGRAINE) [phone] MG tablet Take 1 tablet by mouth every 6 (six) hours as needed for headache.    [provider]  busPIRone (BUSPAR) 7.5 MG tablet Take 15 mg by mouth 3 (three) times daily.    [provider]  citalopram (CELEXA) 10 MG tablet Take 10 mg by mouth daily.    [provider]  cyclobenzaprine (FLEXERIL) 10 MG tablet Take 1 tablet (10 mg total) by mouth 3 (three) times daily as  needed for muscle spasms. 04/22/17   Pricilla LovelessGoldston, Scott, MD  HYDROcodone-acetaminophen (NORCO/VICODIN) 5-325 MG tablet Take 1 tablet by mouth every 6 (six) hours as needed for severe pain. 04/22/17   Pricilla LovelessGoldston, Scott, MD  ibuprofen (ADVIL,MOTRIN) 800 MG tablet Take 800 mg by mouth every 8 (eight) hours as needed for headache or moderate pain.     [provider]  mometasone-formoterol (DULERA) 200-5 MCG/ACT AERO Inhale 2 puffs into the lungs 2 (two) times daily.    [provider]  naproxen (NAPROSYN) 500 MG tablet Take 1 tablet (500 mg total) by mouth 2 (two) times daily with a meal. 04/22/17   Pricilla LovelessGoldston, Scott, MD  rizatriptan (MAXALT-MLT) 10 MG disintegrating tablet Take 1 tablet (10 mg total) by mouth as needed for migraine. May repeat in 2 hours if needed 02/04/15   Nilda RiggsMartin, Nancy Carolyn, NP    Family History No family history on file.  Social History Social History   Tobacco Use  . Smoking status: Never Smoker  . Smokeless tobacco: Never Used  Substance Use Topics  . Alcohol use: Yes    Alcohol/week: 0.0 standard drinks    Comment: rarely  . Drug use: No     Allergies   Bee pollen; Other; Tomato; Cephalosporins; Codeine; Oxycodone; and Prednisone   Review of Systems Review of Systems  Constitutional: Negative for fever.  HENT: Negative for sore throat.   Eyes: Negative for redness.  Respiratory: Negative for shortness of breath.   Cardiovascular: Negative for chest pain.  Gastrointestinal: Positive for abdominal pain. Negative for vomiting.  Genitourinary: Negative for dysuria, flank pain and hematuria.  Musculoskeletal: Negative for back pain and neck pain.  Skin: Negative for rash.  Neurological: Negative for headaches.  Hematological: Does not bruise/bleed easily.  Psychiatric/Behavioral: Negative for confusion.     Physical Exam Updated Vital Signs BP 115/60   Pulse 74   Temp 98.4 F (36.9 C) (Oral)   Resp 16   LMP 11/12/2014   SpO2 100%    Physical Exam  Constitutional: She appears well-developed and well-nourished.  HENT:  Mouth/Throat: Oropharynx is clear and moist.  Eyes: Conjunctivae are normal. No scleral icterus.  Neck: Neck supple. No tracheal deviation present.  Cardiovascular: Normal rate, regular rhythm, normal heart sounds and intact distal pulses.  No murmur heard. Pulmonary/Chest: Effort normal and breath sounds normal. No respiratory distress.  Abdominal: Soft. Normal appearance and bowel sounds are normal. She exhibits no distension. There is tenderness.  Moderate rlq tenderness.   Genitourinary:  Genitourinary Comments: No cva tenderness  Musculoskeletal: She exhibits no edema.  Neurological: She is alert.  Skin: Skin is warm and dry. No rash noted.  Psychiatric: She has a normal mood and affect.  Nursing note and vitals reviewed.    ED Treatments / Results  Labs (all labs ordered are listed, but only abnormal results are displayed) Results for orders placed or performed during the hospital encounter of 06/06/18  Lipase, blood  Result Value Ref Range   Lipase 34 11 - 51 U/L  Comprehensive metabolic panel  Result Value Ref Range   Sodium 140 135 - 145 mmol/L   Potassium 4.2 3.5 - 5.1 mmol/L   Chloride 108 98 - 111 mmol/L   CO2 25 22 - 32 mmol/L   Glucose, Bld 91 70 - 99 mg/dL   BUN 10 6 - 20 mg/dL   Creatinine, Ser 4.09 0.44 - 1.00 mg/dL   Calcium 9.2 8.9 - 81.1 mg/dL   Total Protein 6.5 6.5 - 8.1 g/dL   Albumin 3.9 3.5 - 5.0 g/dL   AST 16 15 - 41 U/L   ALT 12 0 - 44 U/L   Alkaline Phosphatase 43 38 - 126 U/L   Total Bilirubin 0.7 0.3 - 1.2 mg/dL   GFR calc non Af Amer >60 >60 mL/min   GFR calc Af Amer >60 >60 mL/min   Anion gap 7 5 - 15  CBC  Result Value Ref Range   WBC 6.6 4.0 - 10.5 K/uL   RBC 4.02 3.87 - 5.11 MIL/uL   Hemoglobin 11.9 (L) 12.0 - 15.0 g/dL   HCT 91.4 (L) 78.2 - 95.6 %   MCV 86.6 78.0 - 100.0 fL   MCH 29.6 26.0 - 34.0 pg   MCHC 34.2 30.0 - 36.0 g/dL   RDW  21.3 08.6 - 57.8 %   Platelets 182 150 - 400 K/uL  Urinalysis, Routine w reflex microscopic  Result Value Ref Range   Color, Urine YELLOW YELLOW   APPearance CLEAR CLEAR   Specific Gravity, Urine 1.026 1.005 - 1.030   pH 5.0 5.0 - 8.0   Glucose, UA NEGATIVE NEGATIVE mg/dL   Hgb urine dipstick NEGATIVE NEGATIVE   Bilirubin Urine NEGATIVE NEGATIVE   Ketones, ur NEGATIVE NEGATIVE mg/dL   Protein, ur NEGATIVE NEGATIVE mg/dL   Nitrite NEGATIVE NEGATIVE  Leukocytes, UA NEGATIVE NEGATIVE   EKG None  Radiology US Transvaginal Non-ob  Result Date: 06/06/2018 CLINICAL DATA:  Initial evaluation for acute right-sided pelvic pain. History of prior hysterectomy. EXAM: TRANSABDOMINAL AND TRANSVAGINAL ULTRASOUND OF PELVIS DOPPLER ULTRASOUND OF OVARIES TECHNIQUE: Both transabdominal and transvaginal ultrasound examinations of the pelvis were performed. Transabdominal technique was performed for global imaging of the pelvis including uterus, ovaries, adnexal regions, and pelvic cul-de-sac. It was necessary to proceed with endovaginal exam following the transabdominal exam to visualize the pelvic structures. Color and duplex Doppler ultrasound was utilized to evaluate blood flow to the ovaries. COMPARISON:  Prior CT from earlier the same day. FINDINGS: Uterus Prior hysterectomy.  No abnormality at the vaginal cuff. Endometrium Surgically absent. Right ovary Measurements: 3.7 x 3.0 x 2.8 cm. Complex crenulated cystic lesion with increased peripheral vascularity measuring 2.7 x 1.6 x 2.4 cm, most consistent with a corpus luteal cyst. Left ovary Measurements: 2.9 x 1.4 x 1.7 cm. Normal appearance/no adnexal mass. Pulsed Doppler evaluation of both ovaries demonstrates normal low-resistance arterial and venous waveforms. Other findings No abnormal free fluid. IMPRESSION: 1. 2.7 cm normal physiologic right ovarian corpus luteal cyst. 2. Otherwise normal and unremarkable sonographic appearance of the ovaries. No  evidence for torsion or other acute abnormality. 3. Prior hysterectomy. Electronically Signed   By: Rise Mu M.D.   On: 06/06/2018 13:56   US Pelvis Complete  Result Date: 06/06/2018 CLINICAL DATA:  Initial evaluation for acute right-sided pelvic pain. History of prior hysterectomy. EXAM: TRANSABDOMINAL AND TRANSVAGINAL ULTRASOUND OF PELVIS DOPPLER ULTRASOUND OF OVARIES TECHNIQUE: Both transabdominal and transvaginal ultrasound examinations of the pelvis were performed. Transabdominal technique was performed for global imaging of the pelvis including uterus, ovaries, adnexal regions, and pelvic cul-de-sac. It was necessary to proceed with endovaginal exam following the transabdominal exam to visualize the pelvic structures. Color and duplex Doppler ultrasound was utilized to evaluate blood flow to the ovaries. COMPARISON:  Prior CT from earlier the same day. FINDINGS: Uterus Prior hysterectomy.  No abnormality at the vaginal cuff. Endometrium Surgically absent. Right ovary Measurements: 3.7 x 3.0 x 2.8 cm. Complex crenulated cystic lesion with increased peripheral vascularity measuring 2.7 x 1.6 x 2.4 cm, most consistent with a corpus luteal cyst. Left ovary Measurements: 2.9 x 1.4 x 1.7 cm. Normal appearance/no adnexal mass. Pulsed Doppler evaluation of both ovaries demonstrates normal low-resistance arterial and venous waveforms. Other findings No abnormal free fluid. IMPRESSION: 1. 2.7 cm normal physiologic right ovarian corpus luteal cyst. 2. Otherwise normal and unremarkable sonographic appearance of the ovaries. No evidence for torsion or other acute abnormality. 3. Prior hysterectomy. Electronically Signed   By: Rise Mu M.D.   On: 06/06/2018 13:56   Ct Abdomen Pelvis W Contrast  Result Date: 06/06/2018 CLINICAL DATA:  33 year old female with a history of abdominal pain and nausea EXAM: CT ABDOMEN AND PELVIS WITH CONTRAST TECHNIQUE: Multidetector CT imaging of the abdomen and  pelvis was performed using the standard protocol following bolus administration of intravenous contrast. CONTRAST:  ISOVUE-300 IOPAMIDOL (ISOVUE-300) INJECTION 61% COMPARISON:  12/10/2017, 04/06/2016 FINDINGS: Lower chest: No acute abnormality. Hepatobiliary: Unremarkable appearance of the liver. Cholecystectomy Pancreas: Unremarkable pancreas Spleen: Unremarkable spleen Adrenals/Urinary Tract: Unremarkable appearance of the adrenal glands. No evidence of hydronephrosis of the right or left kidney. No nephrolithiasis. Unremarkable course of the bilateral ureters. Unremarkable appearance of the urinary bladder. Stomach/Bowel: Small hiatal hernia. Unremarkable stomach. Unremarkable small bowel with no transition point. No focal wall thickening. Normal appendix. Mild stool  burden. No focal wall thickening of the colon. Diverticular change without evidence of acute diverticulitis. Vascular/Lymphatic: No atherosclerotic changes. Mesenteric and renal arteries are patent. Bilateral iliac arteries and proximal femoral arteries patent. Unremarkable venous structures. No adenopathy Reproductive: Hysterectomy. Rim enhancing crenulated structure associated with the right adnexa. Unremarkable left adnexa. Other: None Musculoskeletal: No acute displaced fracture. IMPRESSION: No acute CT finding of the abdomen/pelvis. Physiologic changes of the right adnexa, with surgical changes of hysterectomy. Cholecystectomy. Diverticular disease without evidence of acute diverticulitis. Small hiatal hernia. Electronically Signed   By: Gilmer Mor D.O.   On: 06/06/2018 11:08   [place] Art/ven Flow Abd Pelv Doppler  Result Date: 06/06/2018 CLINICAL DATA:  Initial evaluation for acute right-sided pelvic pain. History of prior hysterectomy. EXAM: TRANSABDOMINAL AND TRANSVAGINAL ULTRASOUND OF PELVIS DOPPLER ULTRASOUND OF OVARIES TECHNIQUE: Both transabdominal and transvaginal ultrasound examinations of the pelvis were performed.  Transabdominal technique was performed for global imaging of the pelvis including uterus, ovaries, adnexal regions, and pelvic cul-de-sac. It was necessary to proceed with endovaginal exam following the transabdominal exam to visualize the pelvic structures. Color and duplex Doppler ultrasound was utilized to evaluate blood flow to the ovaries. COMPARISON:  Prior CT from earlier the same day. FINDINGS: Uterus Prior hysterectomy.  No abnormality at the vaginal cuff. Endometrium Surgically absent. Right ovary Measurements: 3.7 x 3.0 x 2.8 cm. Complex crenulated cystic lesion with increased peripheral vascularity measuring 2.7 x 1.6 x 2.4 cm, most consistent with a corpus luteal cyst. Left ovary Measurements: 2.9 x 1.4 x 1.7 cm. Normal appearance/no adnexal mass. Pulsed Doppler evaluation of both ovaries demonstrates normal low-resistance arterial and venous waveforms. Other findings No abnormal free fluid. IMPRESSION: 1. 2.7 cm normal physiologic right ovarian corpus luteal cyst. 2. Otherwise normal and unremarkable sonographic appearance of the ovaries. No evidence for torsion or other acute abnormality. 3. Prior hysterectomy. Electronically Signed   By: Rise Mu M.D.   On: 06/06/2018 13:56    Procedures Procedures (including critical care time)  Medications Ordered in ED Medications - No data to display   Initial Impression / Assessment and Plan / ED Course  I have reviewed the triage vital signs and the nursing notes.  Pertinent labs & imaging results that were available during my care of the patient were reviewed by me and considered in my medical decision making (see chart for details).  Iv ns. Labs. Imaging.  Reviewed nursing notes and prior charts for additional history.   Pt declines any pain medication.  Pt now accepting pain med. Acetaminophen and motrin po.   Ct reviewed - normal appendix. Hx ovarian cyst, will get u/s.  U/s reviewed - right ovarian cyst - discussed w  pt, will have f/u gyn.     Final Clinical Impressions(s) / ED Diagnoses   Final diagnoses:  None    ED Discharge Orders    None       Cathren Laine, MD 06/12/18 1427

## 2018-06-06 NOTE — ED Notes (Signed)
Patient transported to CT 

## 2018-06-06 NOTE — Discharge Instructions (Signed)
It was our pleasure to provide your ER care today - we hope that you feel better.  Rest. Drink adequate fluids. Take acetaminophen and/or ibuprofen as need for pain.   Follow up with primary care doctor in the coming week if symptoms fail to improve/resolve.  Return to ER if worse, new symptoms, fevers, worsening/severe pain, persistent vomiting, other concern.

## 2018-06-06 NOTE — ED Triage Notes (Signed)
Patient to ED c/o R-sided abdominal pain that woke her up about 4:30am today. She reports having had diarrhea over the weekend, but none the last couple of days. Also c/o nausea and chills x 2 days. Denies vomiting or urinary symptoms.

## 2018-07-18 IMAGING — CR DG ANKLE COMPLETE 3+V*L*
3 series · 3 of 3 positions shown · non-contrast
Comparison: None.

CLINICAL DATA: Left ankle pain after injury last night

EXAM:
LEFT ANKLE COMPLETE - 3+ VIEW

[ankle ap]
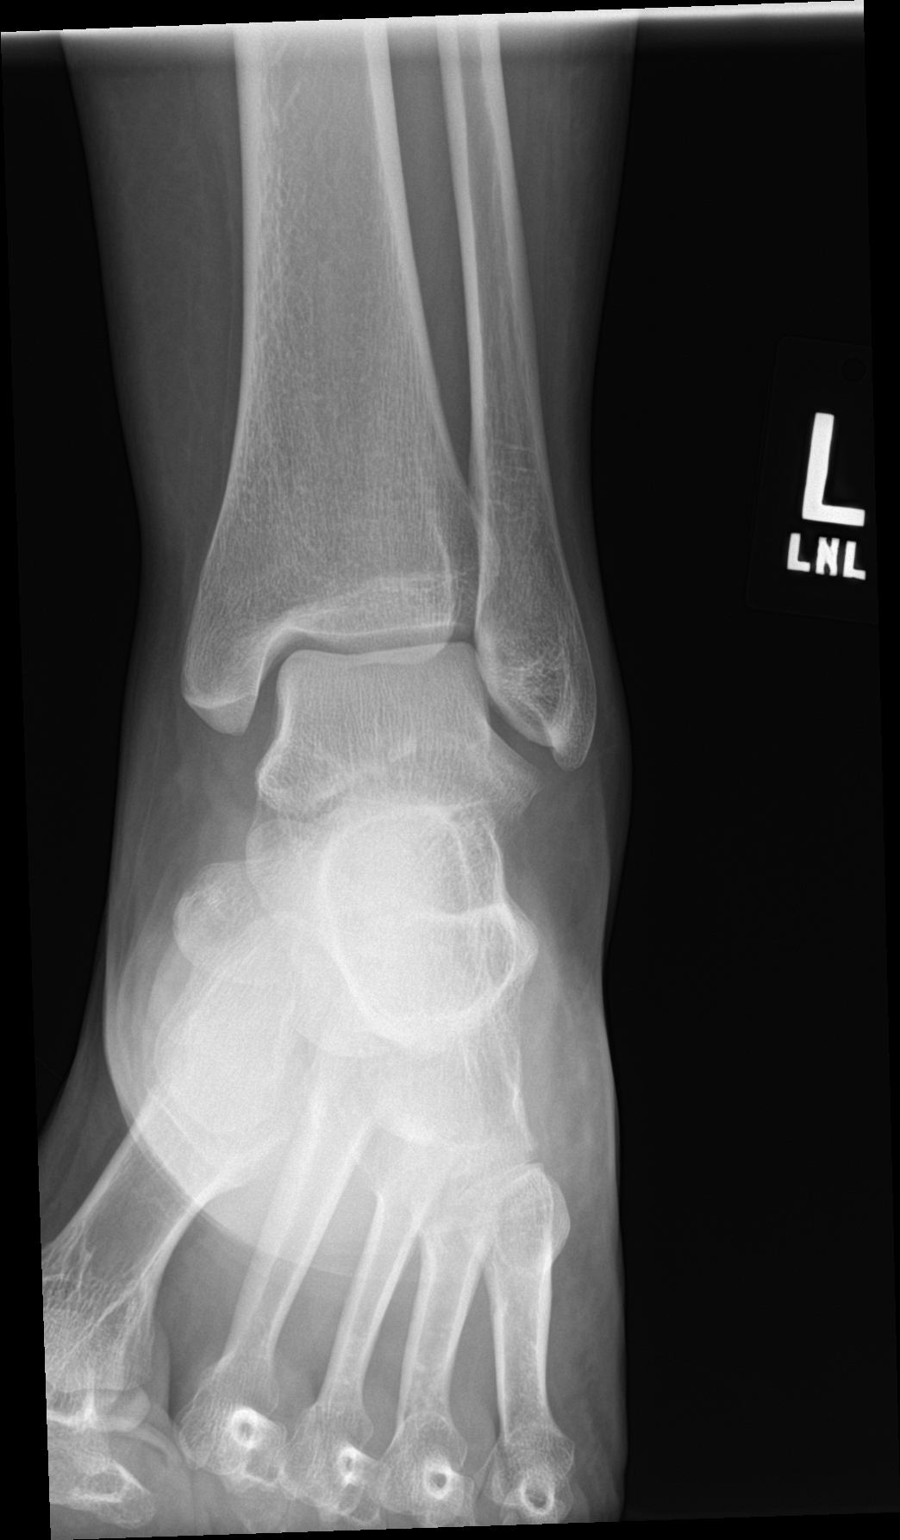

[ankle obl]
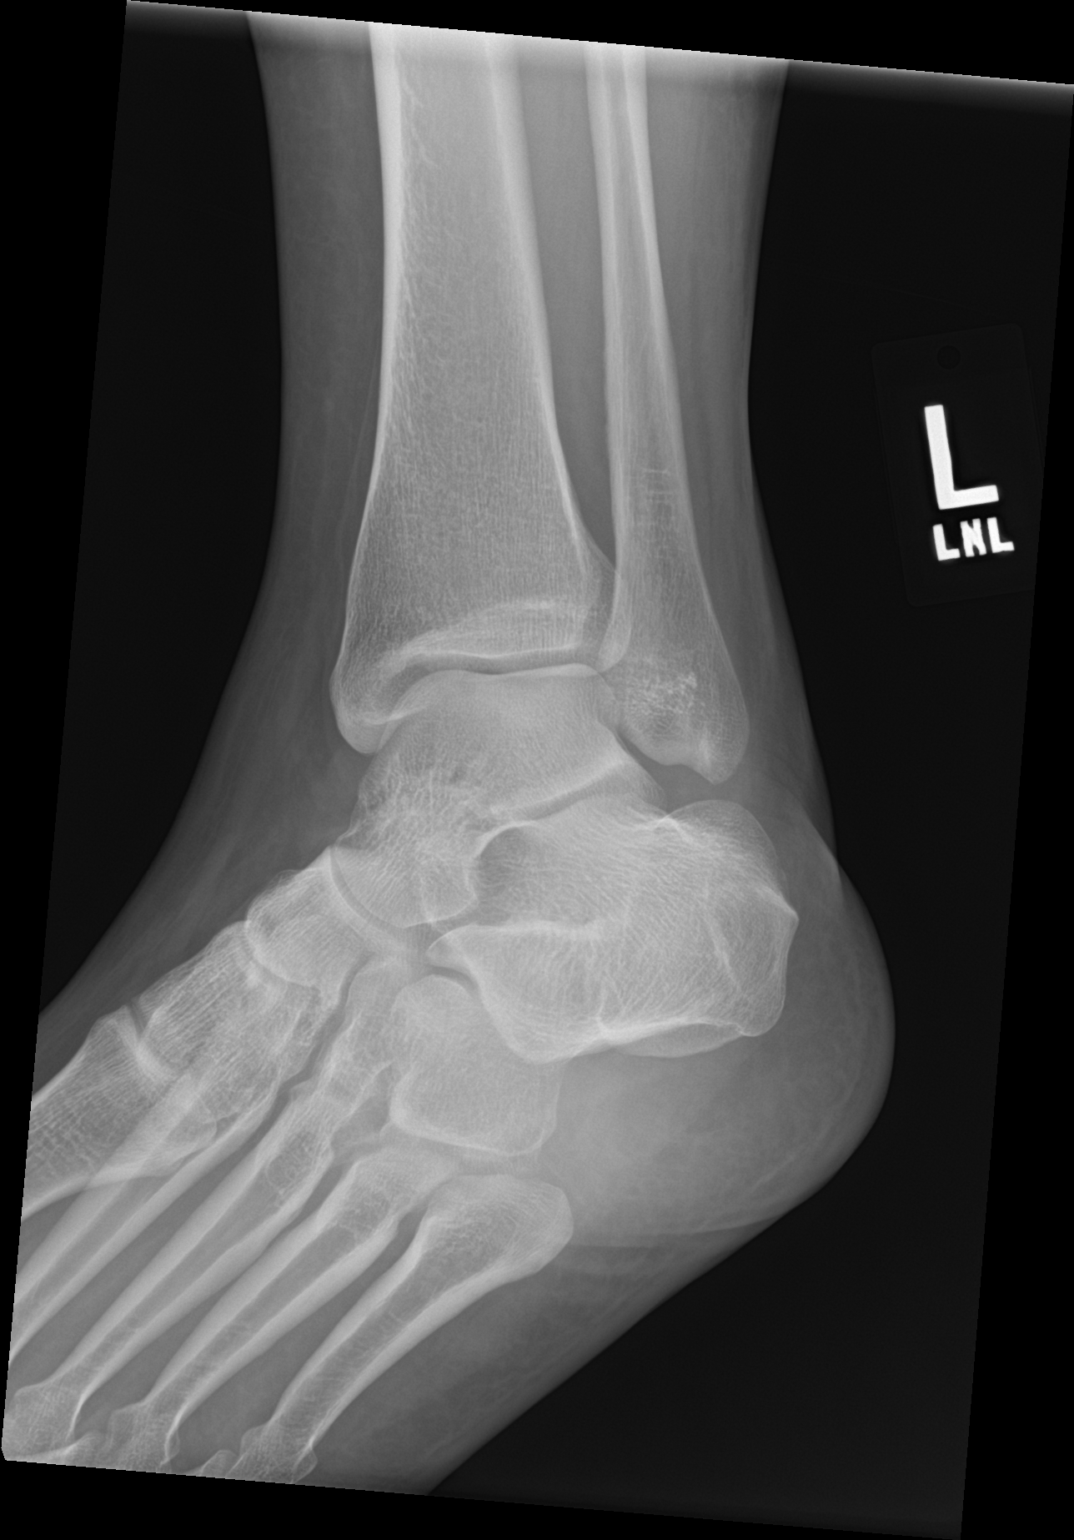

[ankle lat]
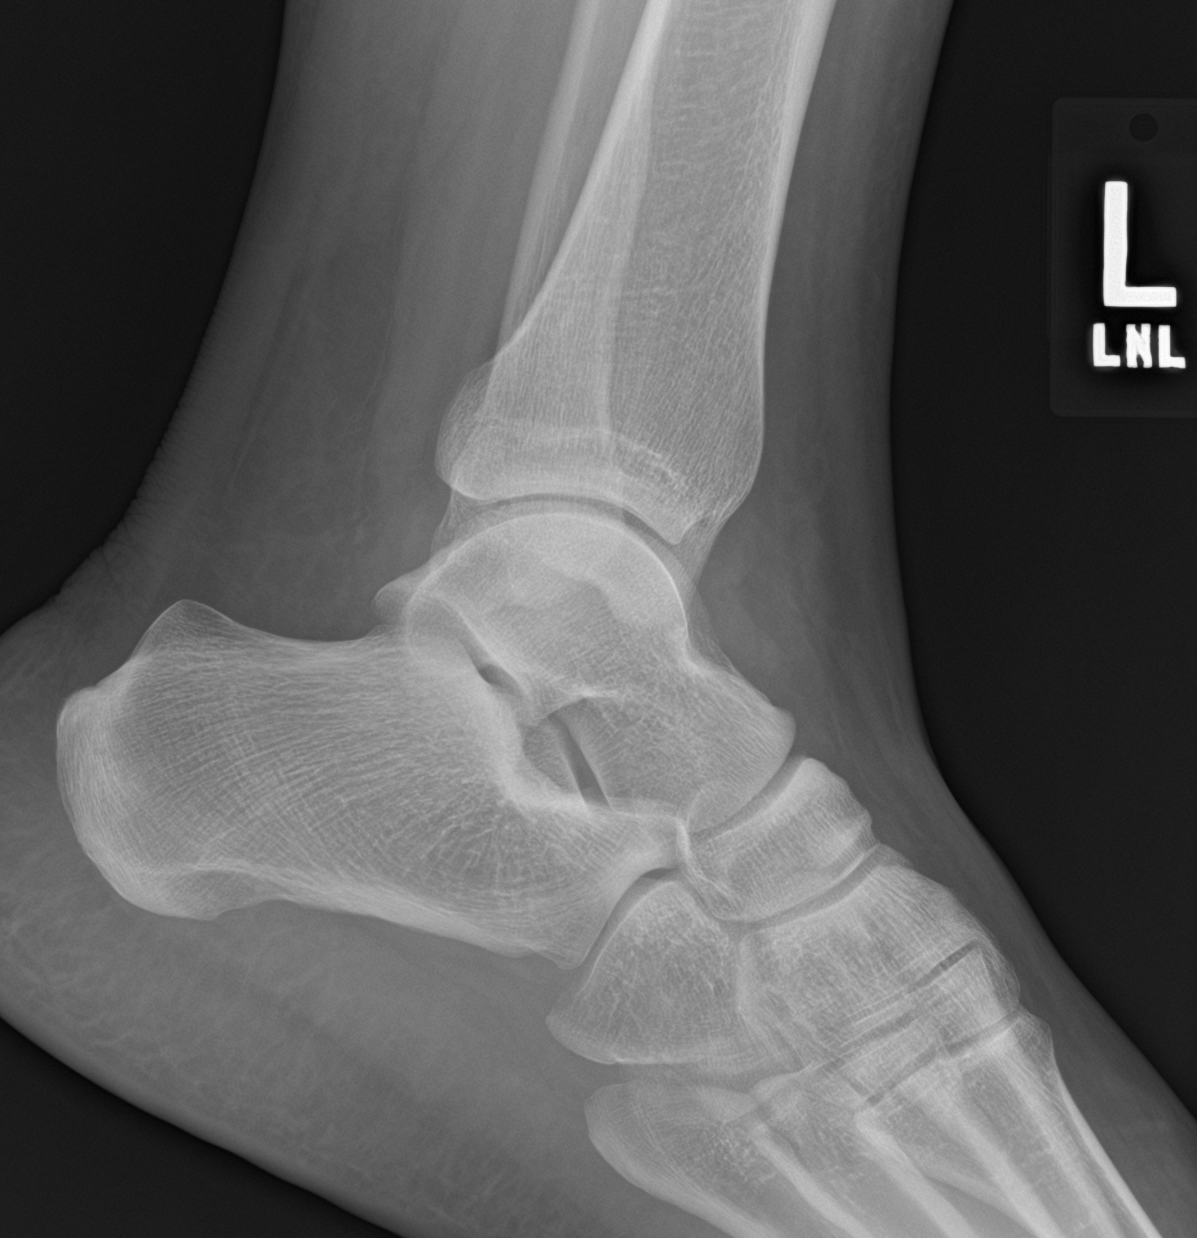

[3 of 3 positions shown; findings below may reference images not displayed]

FINDINGS: Mild diffuse left ankle soft tissue swelling. No left ankle fracture
or subluxation. No suspicious focal osseous lesion, appreciable
arthropathy or radiopaque foreign body.
IMPRESSION: Mild diffuse left ankle soft tissue swelling, with no left ankle
fracture or subluxation.

## 2018-07-18 IMAGING — CR DG FOOT COMPLETE 3+V*L*
3 series · 3 of 3 positions shown · non-contrast
Comparison: None.

CLINICAL DATA: Left foot pain after injury last night.

EXAM:
LEFT FOOT - COMPLETE 3+ VIEW

[foot ap]
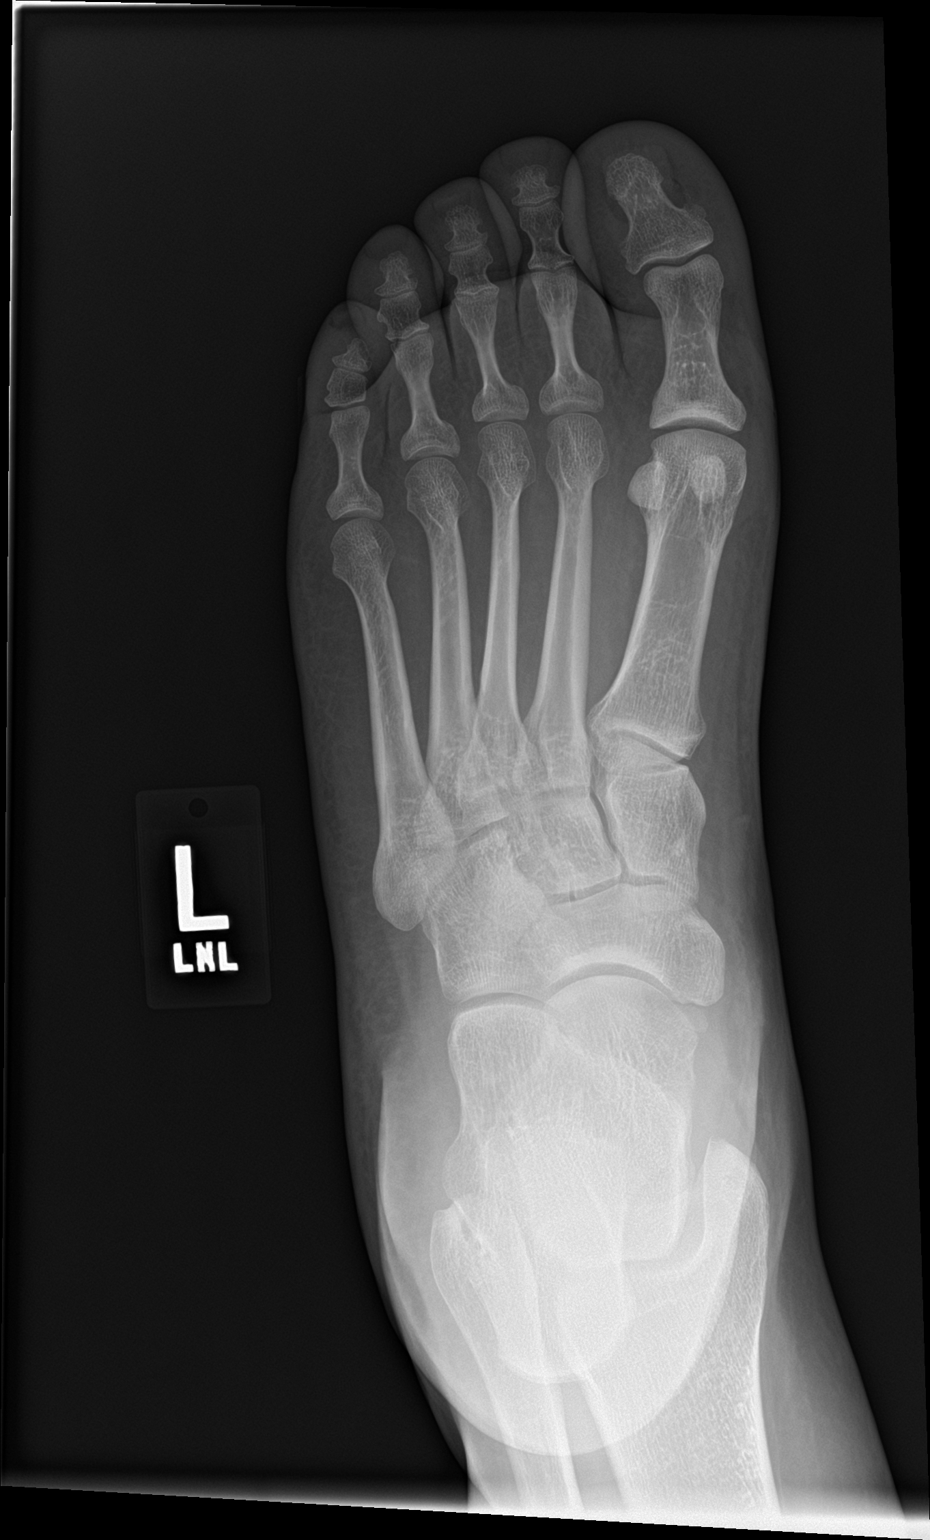

[foot obl]
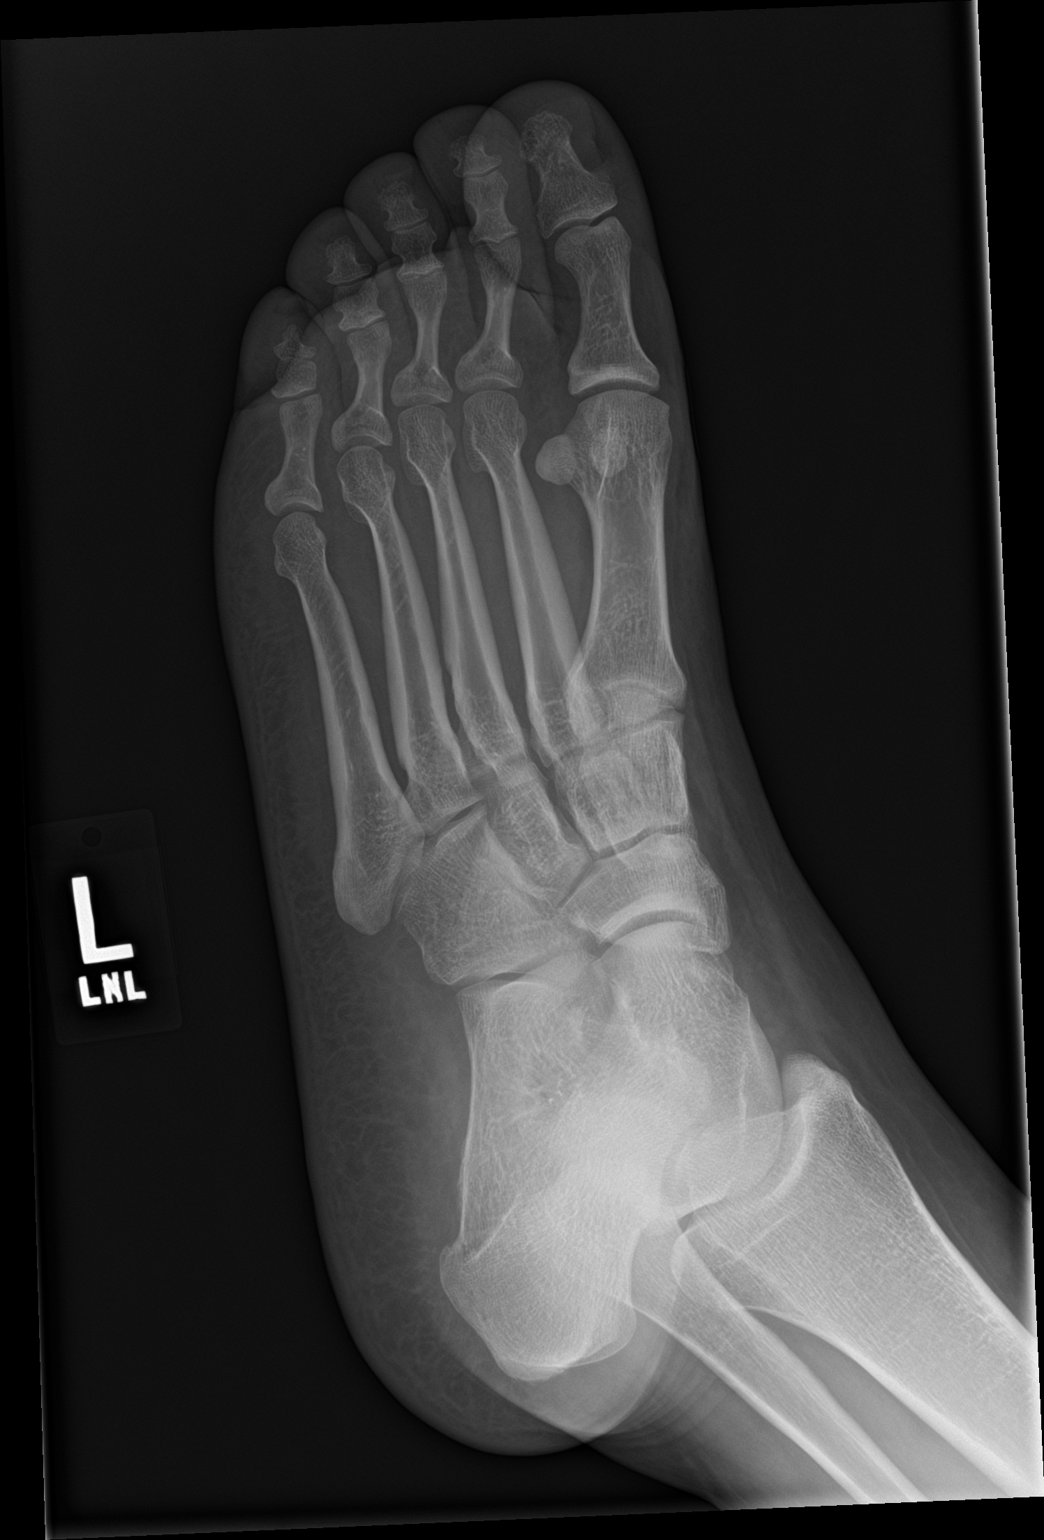

[foot lat]
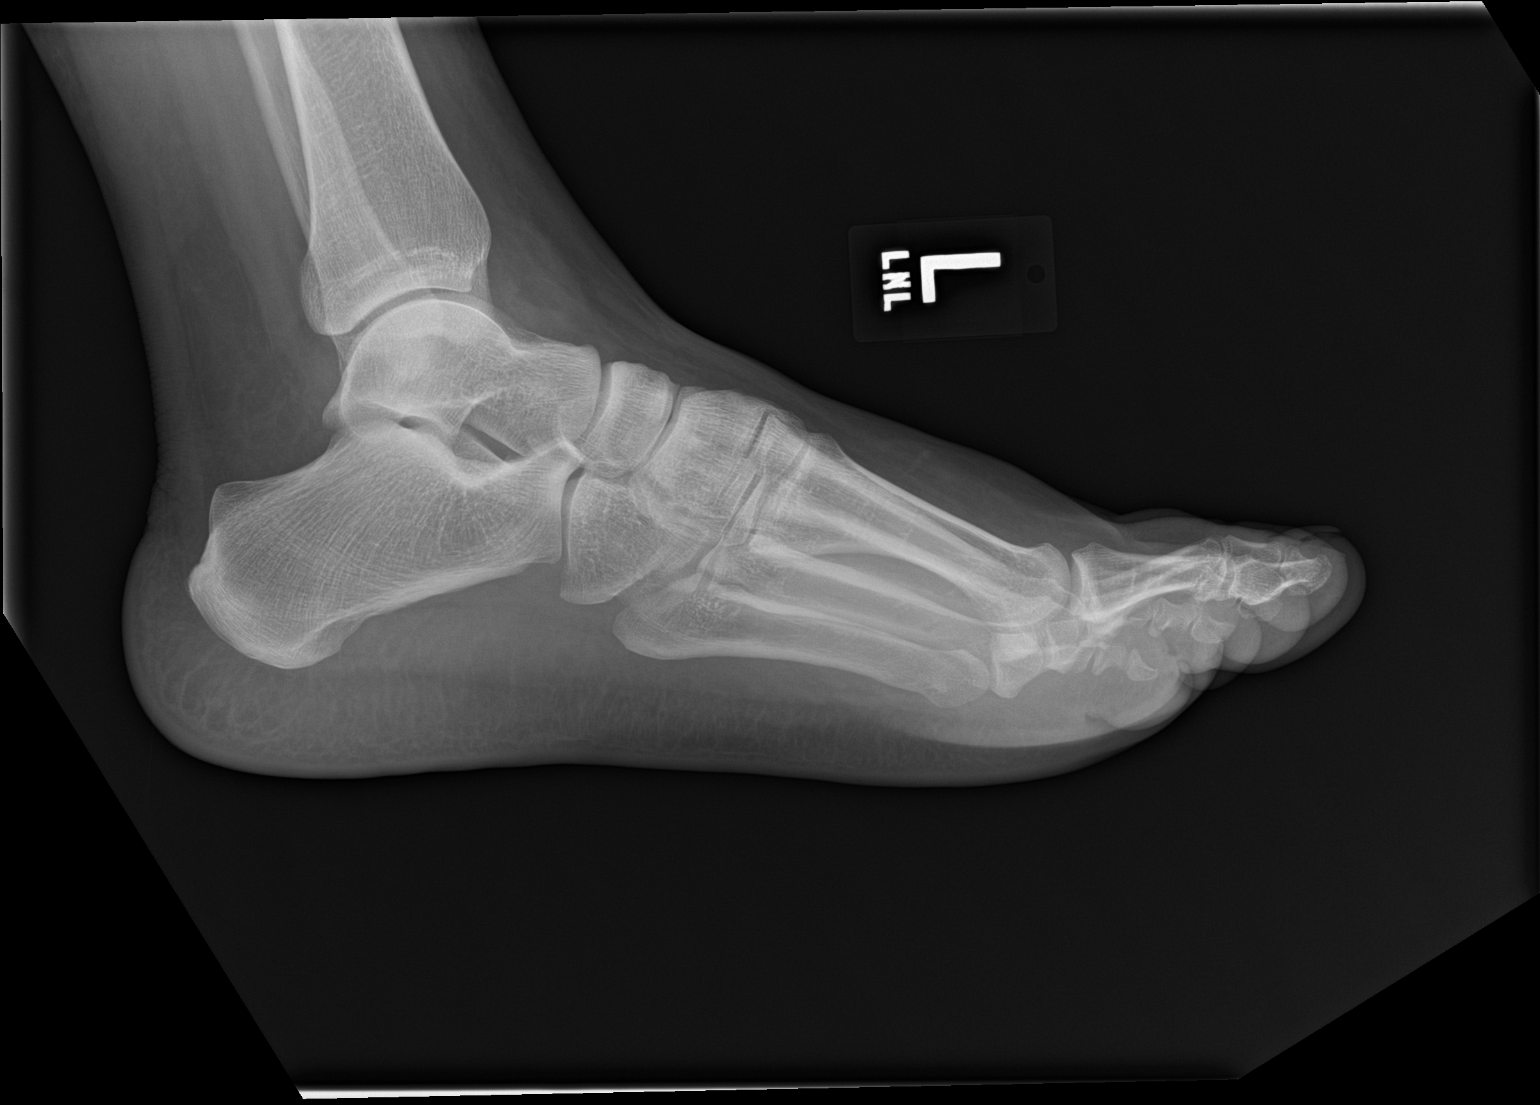

[3 of 3 positions shown; findings below may reference images not displayed]

FINDINGS: There is no evidence of fracture or dislocation. There is no
evidence of arthropathy or other focal bone abnormality. Soft
tissues are unremarkable.
IMPRESSION: Normal left foot.

## 2020-06-24 ENCOUNTER — Encounter: Payer: Self-pay | Admitting: Physical Medicine & Rehabilitation

## 2020-07-07 ENCOUNTER — Encounter
Payer: Managed Care, Other (non HMO) | Attending: Physical Medicine & Rehabilitation | Admitting: Physical Medicine & Rehabilitation

## 2020-07-07 ENCOUNTER — Other Ambulatory Visit: Payer: Self-pay

## 2020-07-07 ENCOUNTER — Encounter: Payer: Self-pay | Admitting: Physical Medicine & Rehabilitation

## 2020-07-07 VITALS — BP 133/87 | HR 69 | Temp 98.8°F | Ht 69.5 in | Wt 250.6 lb

## 2020-07-07 DIAGNOSIS — M47816 Spondylosis without myelopathy or radiculopathy, lumbar region: Secondary | ICD-10-CM | POA: Diagnosis present

## 2020-07-07 DIAGNOSIS — M533 Sacrococcygeal disorders, not elsewhere classified: Secondary | ICD-10-CM | POA: Diagnosis not present

## 2020-07-07 NOTE — Patient Instructions (Signed)
Sacroiliac Joint Dysfunction  Sacroiliac joint dysfunction is a condition that causes inflammation on one or both sides of the sacroiliac (SI) joint. The SI joint connects the lower part of the spine (sacrum) with the two upper portions of the pelvis (ilium). This condition causes deep aching or burning pain in the low back. In some cases, the pain may also spread into one or both buttocks, hips, or thighs. What are the causes? This condition may be caused by:  Pregnancy. During pregnancy, extra stress is put on the SI joints because the pelvis widens.  Injury, such as: ? Injuries from car accidents. ? Sports-related injuries. ? Work-related injuries.  Having one leg that is shorter than the other.  Conditions that affect the joints, such as: ? Rheumatoid arthritis. ? Gout. ? Psoriatic arthritis. ? Joint infection (septic arthritis). Sometimes, the cause of SI joint dysfunction is not known. What are the signs or symptoms? Symptoms of this condition include:  Aching or burning pain in the lower back. The pain may also spread to other areas, such as: ? Buttocks. ? Groin. ? Thighs.  Muscle spasms in or around the painful areas.  Increased pain when standing, walking, running, stair climbing, bending, or lifting. How is this diagnosed? This condition is diagnosed with a physical exam and medical history. During the exam, the health care provider may move one or both of your legs to different positions to check for pain. Various tests may be done to confirm the diagnosis, including:  Imaging tests to look for other causes of pain. These may include: ? MRI. ? CT scan. ? Bone scan.  Diagnostic injection. A numbing medicine is injected into the SI joint using a needle. If your pain is temporarily improved or stopped after the injection, this can indicate that SI joint dysfunction is the problem. How is this treated? Treatment depends on the cause and severity of your condition.  Treatment options may include:  Ice or heat applied to the lower back area after an injury. This may help reduce pain and muscle spasms.  Medicines to relieve pain or inflammation or to relax the muscles.  Wearing a back brace (sacroiliac brace) to help support the joint while your back is healing.  Physical therapy to increase muscle strength around the joint and flexibility at the joint. This may also involve learning proper body positions and ways of moving to relieve stress on the joint.  Direct manipulation of the SI joint.  Injections of steroid medicine into the joint to reduce pain and swelling.  Radiofrequency ablation to burn away nerves that are carrying pain messages from the joint.  Use of a device that provides electrical stimulation to help reduce pain at the joint.  Surgery to put in screws and plates that limit or prevent joint motion. This is rare. Follow these instructions at home: Medicines  Take over-the-counter and prescription medicines only as told by your health care provider.  Do not drive or use heavy machinery while taking prescription pain medicine.  If you are taking prescription pain medicine, take actions to prevent or treat constipation. Your health care provider may recommend that you: ? Drink enough fluid to keep your urine pale yellow. ? Eat foods that are high in fiber, such as fresh fruits and vegetables, whole grains, and beans. ? Limit foods that are high in fat and processed sugars, such as fried or sweet foods. ? Take an over-the-counter or prescription medicine for constipation. If you have a brace:    Wear the brace as told by your health care provider. Remove it only as told by your health care provider.  Keep the brace clean.  If the brace is not waterproof: ? Do not let it get wet. ? Cover it with a watertight covering when you take a bath or a shower. Managing pain, stiffness, and swelling      Icing can help with pain and  swelling. Heat may help with muscle tension or spasms. Ask your health care provider if you should use ice or heat.  If directed, put ice on the affected area: ? If you have a removable brace, remove it as told by your health care provider. ? Put ice in a plastic bag. ? Place a towel between your skin and the bag. ? Leave the ice on for 20 minutes, 2-3 times a day.  If directed, apply heat to the affected area. Use the heat source that your health care provider recommends, such as a moist heat pack or a heating pad. ? Place a towel between your skin and the heat source. ? Leave the heat on for 20-30 minutes. ? Remove the heat if your skin turns bright red. This is especially important if you are unable to feel pain, heat, or cold. You may have a greater risk of getting burned. General instructions  Rest as needed. Ask your health care provider what activities are safe for you.  Return to your normal activities as told by your health care provider.  Exercise as directed by your health care provider or physical therapist.  Do not use any products that contain nicotine or tobacco, such as cigarettes and e-cigarettes. These can delay bone healing. If you need help quitting, ask your health care provider.  Keep all follow-up visits as told by your health care provider. This is important. Contact a health care provider if:  Your pain is not controlled with medicine.  You have a fever.  Your pain is getting worse. Get help right away if:  You have weakness, numbness, or tingling in your legs or feet.  You lose control of your bladder or bowel. Summary  Sacroiliac joint dysfunction is a condition that causes inflammation on one or both sides of the sacroiliac (SI) joint.  This condition causes deep aching or burning pain in the low back. In some cases, the pain may also spread into one or both buttocks, hips, or thighs.  Treatment depends on the cause and severity of your condition.  It may include medicines to reduce pain and swelling or to relax muscles. This information is not intended to replace advice given to you by your health care provider. Make sure you discuss any questions you have with your health care provider. Document Revised: 05/23/2018 Document Reviewed: 11/06/2017 Elsevier Patient Education  2020 Elsevier Inc.  

## 2020-07-07 NOTE — Progress Notes (Signed)
Subjective:    Patient ID: Alyssa Owen, female    DOB: 02-Feb-1986, 34 y.o.   MRN: [id number]  HPI 34 year old female with history of migraine headaches, IBS as well as asthma who has primary complaints of severe low back pain.  She has no history of falls or trauma.  No recent infections or injuries.  The patient feels like pain increases with walking bending sitting and standing but also has complaints at night.  The arched back position "i.e. cat" seems to relieve this.  The patient also complains of left hip pain.  She feels like this is more lateral.  No groin pain.  She gets some improvement with heat as well as TENS unit. She can walk about 30 to 45 minutes before it becomes severe.  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 She has some numbness in the upper extremities but none in the lower extremities.  She has occasional right ankle pain She exercises 2 to 4 days a week at a moderate level her exercise time per session is 40 to 75 minutes and includes walking and strength exercises. For pain she will use Tylenol if needed and occasionally ibuprofen or naproxen.  She also use cream such as Tiger balm BenGay and icy hot She is not interested in trying any pain medications.  She would like to know what the cause of her pain is MRI of the lumbar spine was reviewed with patient.  Films were also reviewed.  No significant disc herniation.  There is dehydrated disc at L5-S1.  Also evidence of lumbar spondylosis with facet joint hypertrophy bilateral L4-5 and L5-S1.  No evidence of lumbar spinal stenosis.  No formal hip x-rays reviewed however scalp films for CT abdomen and pelvis reviewed showing no signs of osteoarthritis in the hip joints. Pain Inventory Average Pain 10 Pain Right Now 8 My pain is intermittent, constant, sharp, burning, stabbing and aching  In the last 24 hours, has pain interfered with the following? General activity 10 Relation with others  8 Enjoyment of life 10 What TIME of day is your pain at its worst? varies Sleep (in general) Poor  Pain is worse with: walking, bending, sitting, inactivity, standing and some activites Pain improves with: heat/ice and TENS Relief from Meds: na  walk without assistance how many minutes can you walk? 30-45 ability to climb steps?  yes do you drive?  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  yes  numbness tingling  Any changes since last visit?  no  Virl Son PCP   No family history on file. Social History   Socioeconomic History  . Marital status: Married    Spouse name: Michelle Piper  . Number of children: 3  . Years of education: college  . Highest education level: Not on file  Occupational History    Employer: [employer]  Tobacco Use  . Smoking status: Never Smoker  . Smokeless tobacco: Never Used  Substance and Sexual Activity  . Alcohol use: Yes    Alcohol/week: 0.0 standard drinks    Comment: rarely  . Drug use: No  . Sexual activity: Never    Birth control/protection: Surgical  Other Topics Concern  . Not on file  Social History Narrative   Patient lives at home with her husband and children. Patient is not working at this time.   Education some college   Right  handed.   Caffeine sometimes.            Social Determinants of Health   Financial Resource Strain:   . Difficulty of Paying Living Expenses: Not on file  Food Insecurity:   . Worried About Programme researcher, broadcasting/film/video in the Last Year: Not on file  . Ran Out of Food in the Last Year: Not on file  Transportation Needs:   . Lack of Transportation (Medical): Not on file  . Lack of Transportation (Non-Medical): Not on file  Physical Activity:   . Days of Exercise per Week: Not on file  . Minutes of Exercise per Session: Not on file  Stress:   . Feeling of Stress : Not on file  Social Connections:   . Frequency of Communication with  Friends and Family: Not on file  . Frequency of Social Gatherings with Friends and Family: Not on file  . Attends Religious Services: Not on file  . Active Member of Clubs or Organizations: Not on file  . Attends Banker Meetings: Not on file  . Marital Status: Not on file   Past Surgical History:  Procedure Laterality Date  . ABDOMINAL HYSTERECTOMY    . BREAST LUMPECTOMY    . CHOLECYSTECTOMY    . KNEE SURGERY    . ROTATOR CUFF REPAIR    . TUBAL LIGATION    . uterine ablation     Past Medical History:  Diagnosis Date  . Asthma   . IBS (irritable bowel syndrome)   . Migraine    BP 133/87   Pulse 69   Temp 98.8 F (37.1 C)   Ht 5' 9.5" (1.765 m)   Wt 250 lb 9.6 oz (113.7 kg)   LMP 11/12/2014   SpO2 97%   BMI 36.48 kg/m   Opioid Risk Score:   Fall Risk Score:  `1  Depression screen PHQ 2/9  Depression screen PHQ 2/9 07/07/2020  Decreased Interest 0  Down, Depressed, Hopeless 0  PHQ - 2 Score 0  Altered sleeping 3  Tired, decreased energy 3  Change in appetite 0  Feeling bad or failure about yourself  0  Trouble concentrating 0  Moving slowly or fidgety/restless 0  Suicidal thoughts 0  PHQ-9 Score 6    Review of Systems  Constitutional: Negative.   HENT: Negative.   Eyes: Negative.   Respiratory: Negative.   Cardiovascular: Positive for leg swelling.  Gastrointestinal: Positive for constipation and diarrhea.  Endocrine: Negative.   Genitourinary: Negative.   Musculoskeletal: Positive for back pain.       Left hip  Skin: Negative.   Neurological: Positive for numbness.       Tingling  Hematological: Negative.   Psychiatric/Behavioral: Negative.   All other systems reviewed and are negative.      Objective:   Physical Exam Vitals and nursing note reviewed.  Constitutional:      Appearance: She is obese.  Eyes:     Extraocular Movements: Extraocular movements intact.     Conjunctiva/sclera: Conjunctivae normal.     Pupils: Pupils  are equal, round, and reactive to light.  Cardiovascular:     Rate and Rhythm: Normal rate and regular rhythm.     Heart sounds: Normal heart sounds. No murmur heard.   Pulmonary:     Effort: Pulmonary effort is normal. No respiratory distress.     Breath sounds: Normal breath sounds. No wheezing.  Abdominal:     General: Abdomen is flat. Bowel sounds  are normal. There is no distension.     Palpations: Abdomen is soft.     Tenderness: There is no abdominal tenderness.  Musculoskeletal:     Cervical back: Normal range of motion. No rigidity.     Comments: There is normal hip range of motion bilaterally there is pain in the sacroiliac region with Faber's testing on the left side. Lumbar range of motion is 50% with flexion extension lateral bending and rotation.  The patient has no directional component to pain There is tenderness palpation along the lumbar spinous process of L4 and L5 as well as paraspinal tenderness.  Skin:    General: Skin is warm and dry.  Neurological:     Mental Status: She is alert and oriented to person, place, and time.     Motor: No weakness.     Coordination: Coordination normal.     Gait: Gait normal.  Psychiatric:        Mood and Affect: Mood normal.        Behavior: Behavior normal.        Thought Content: Thought content normal.        Judgment: Judgment normal.           Assessment & Plan:  #1.  Bilateral  back pain, left hip pain.  We discussed the differential diagnosis.  Most of her pain is below L5 region and positive Faber's on the left strongly suggest left sacroiliac pain.  We will set up for diagnostic left sacroiliac injection.  If this is helpful focused chiropractic or physical therapy treatments would be indicated.  We discussed that if this is not helpful lumbar facet arthropathy may be pain generator as well.  In that case we would  perform lumbar medial branch blocks.  No pain medications will be prescribed.  Discussed spine anatomy,  potential for multiple pain generators.

## 2020-07-24 ENCOUNTER — Other Ambulatory Visit: Payer: Self-pay | Admitting: Family Medicine

## 2020-07-24 ENCOUNTER — Other Ambulatory Visit: Payer: Self-pay

## 2020-07-24 ENCOUNTER — Ambulatory Visit
Admission: RE | Admit: 2020-07-24 | Discharge: 2020-07-24 | Disposition: A | Discharge status: Home / Self Care | Payer: Managed Care, Other (non HMO) | Source: Ambulatory Visit | Attending: Family Medicine | Admitting: Family Medicine

## 2020-07-24 ENCOUNTER — Encounter: Payer: Managed Care, Other (non HMO) | Admitting: Physical Medicine & Rehabilitation

## 2020-07-24 DIAGNOSIS — M25571 Pain in right ankle and joints of right foot: Secondary | ICD-10-CM

## 2020-07-24 DIAGNOSIS — G8929 Other chronic pain: Secondary | ICD-10-CM

## 2020-07-28 ENCOUNTER — Ambulatory Visit: Payer: Managed Care, Other (non HMO) | Admitting: Physical Medicine & Rehabilitation

## 2020-07-30 MED ORDER — MELOXICAM 7.5 MG PO TABS: 7.5000 mg | ORAL_TABLET | Freq: Every day | ORAL | 0 refills | Status: AC

## 2020-07-30 NOTE — Addendum Note (Signed)
Addended by: Erick Colace on: 07/30/2020 04:30 PM   Modules accepted: Orders

## 2020-07-31 ENCOUNTER — Telehealth: Payer: Self-pay | Admitting: *Deleted

## 2020-07-31 NOTE — Telephone Encounter (Signed)
I spoke with Alyssa Owen. She is not planning on returning to our office. She did not allow me to give her directions about the meloxicam.

## 2020-07-31 NOTE — Telephone Encounter (Signed)
Left message for pt to call me to discuss

## 2020-07-31 NOTE — Telephone Encounter (Signed)
-----   Message from Erick Colace, MD sent at 07/30/2020  4:26 PM EDT ----- Sending order for meloxicam, please call pt to stop taking prn Naprosyn and Ibuprofen WIll need to see for f/u visit to help justify Sacroiliac injection which has been denied Need to see pt in 2-3 wks

## 2020-08-17 ENCOUNTER — Ambulatory Visit: Payer: Managed Care, Other (non HMO) | Admitting: Podiatry

## 2020-09-06 ENCOUNTER — Other Ambulatory Visit (HOSPITAL_COMMUNITY): Payer: Self-pay | Admitting: Physician Assistant

## 2020-09-06 ENCOUNTER — Encounter: Payer: Self-pay | Admitting: Physician Assistant

## 2020-09-06 NOTE — Progress Notes (Signed)
I connected by phone with Alyssa Owen on 09/06/2020 at 11:29 AM to discuss the potential use of a new treatment for mild to moderate COVID-19 viral infection in non-hospitalized patients.  This patient is a 34 y.o. female that meets the FDA criteria for Emergency Use Authorization of COVID monoclonal antibody casirivimab/imdevimab, bamlanivimab/eteseviamb, or sotrovimab.  Has a (+) direct SARS-CoV-2 viral test result  Has mild or moderate COVID-19   Is NOT hospitalized due to COVID-19  Is within 10 days of symptom onset  Has at least one of the high risk factor(s) for progression to severe COVID-19 and/or hospitalization as defined in EUA.  Specific high risk criteria : BMI > 25 and Chronic Lung Disease   I have spoken and communicated the following to the patient or parent/caregiver regarding COVID monoclonal antibody treatment:  1. FDA has authorized the emergency use for the treatment of mild to moderate COVID-19 in adults and pediatric patients with positive results of direct SARS-CoV-2 viral testing who are 19 years of age and older weighing at least 40 kg, and who are at high risk for progressing to severe COVID-19 and/or hospitalization.  2. The significant known and potential risks and benefits of COVID monoclonal antibody, and the extent to which such potential risks and benefits are unknown.  3. Information on available alternative treatments and the risks and benefits of those alternatives, including clinical trials.  4. Patients treated with COVID monoclonal antibody should continue to self-isolate and use infection control measures (e.g., wear mask, isolate, social distance, avoid sharing personal items, clean and disinfect "high touch" surfaces, and frequent handwashing) according to CDC guidelines.   5. The patient or parent/caregiver has the option to accept or refuse COVID monoclonal antibody treatment.  After reviewing this information with the patient, the patient has  agreed to receive one of the available covid 19 monoclonal antibodies and will be provided an appropriate fact sheet prior to infusion. Jodell Cipro, PA-C 09/06/2020 11:29 AM

## 2020-09-07 ENCOUNTER — Ambulatory Visit (HOSPITAL_COMMUNITY)
Admission: RE | Admit: 2020-09-07 | Discharge: 2020-09-07 | Disposition: A | Discharge status: Home / Self Care | Payer: Managed Care, Other (non HMO) | Source: Ambulatory Visit | Attending: Pulmonary Disease | Admitting: Pulmonary Disease

## 2020-09-07 ENCOUNTER — Other Ambulatory Visit (HOSPITAL_COMMUNITY): Payer: Self-pay

## 2020-09-07 DIAGNOSIS — U071 COVID-19: Secondary | ICD-10-CM | POA: Insufficient documentation

## 2020-09-07 MED ORDER — SOTROVIMAB 500 MG/8ML IV SOLN
500.0000 mg | Freq: Once | INTRAVENOUS | Status: AC
  Administered 2020-09-07: 500 mg via INTRAVENOUS

## 2020-09-07 MED ORDER — METHYLPREDNISOLONE SODIUM SUCC 125 MG IJ SOLR: 125.0000 mg | Freq: Once | INTRAMUSCULAR | Status: DC | PRN

## 2020-09-07 MED ORDER — ALBUTEROL SULFATE HFA 108 (90 BASE) MCG/ACT IN AERS: 2.0000 | INHALATION_SPRAY | Freq: Once | RESPIRATORY_TRACT | Status: DC | PRN

## 2020-09-07 MED ORDER — FAMOTIDINE IN NACL 20-0.9 MG/50ML-% IV SOLN: 20.0000 mg | Freq: Once | INTRAVENOUS | Status: DC | PRN

## 2020-09-07 MED ORDER — EPINEPHRINE 0.3 MG/0.3ML IJ SOAJ: 0.3000 mg | Freq: Once | INTRAMUSCULAR | Status: DC | PRN

## 2020-09-07 MED ORDER — DIPHENHYDRAMINE HCL 50 MG/ML IJ SOLN: 50.0000 mg | Freq: Once | INTRAMUSCULAR | Status: DC | PRN

## 2020-09-07 MED ORDER — SODIUM CHLORIDE 0.9 % IV SOLN: INTRAVENOUS | Status: DC | PRN

## 2020-09-07 NOTE — Progress Notes (Signed)
Patient reviewed Fact Sheet for Patients, Parents, and Caregivers for Emergency Use Authorization (EUA) of Sotrovimab for the Treatment of Coronavirus. Patient also reviewed and is agreeable to the estimated cost of treatment. Patient is agreeable to proceed.   

## 2020-09-07 NOTE — Discharge Instructions (Signed)
10 Things You Can Do to Manage Your COVID-19 Symptoms at Home If you have possible or confirmed COVID-19: 1. Stay home from work and school. And stay away from other public places. If you must go out, avoid using any kind of public transportation, ridesharing, or taxis. 2. Monitor your symptoms carefully. If your symptoms get worse, call your healthcare provider immediately. 3. Get rest and stay hydrated. 4. If you have a medical appointment, call the healthcare provider ahead of time and tell them that you have or may have COVID-19. 5. For medical emergencies, call 911 and notify the dispatch personnel that you have or may have COVID-19. 6. Cover your cough and sneezes with a tissue or use the inside of your elbow. 7. Wash your hands often with soap and water for at least 20 seconds or clean your hands with an alcohol-based hand sanitizer that contains at least 60% alcohol. 8. As much as possible, stay in a specific room and away from other people in your home. Also, you should use a separate bathroom, if available. If you need to be around other people in or outside of the home, wear a mask. 9. Avoid sharing personal items with other people in your household, like dishes, towels, and bedding. 10. Clean all surfaces that are touched often, like counters, tabletops, and doorknobs. Use household cleaning sprays or wipes according to the label instructions. cdc.gov/coronavirus 04/10/2019 This information is not intended to replace advice given to you by your health care provider. Make sure you discuss any questions you have with your health care provider. Document Revised: 09/12/2019 Document Reviewed: 09/12/2019 Elsevier Patient Education  2020 Else[employer] Inc.  What types of side effects do monoclonal antibody drugs cause?  Common side effects  In general, the more common side effects caused by monoclonal antibody drugs include: . Allergic reactions, such as hives or itching . Flu-like signs and  symptoms, including chills, fatigue, fever, and muscle aches and pains . Nausea, vomiting . Diarrhea . Skin rashes . Low blood pressure   The CDC is recommending patients who receive monoclonal antibody treatments wait at least 90 days before being vaccinated.  Currently, there are no data on the safety and efficacy of mRNA COVID-19 vaccines in persons who received monoclonal antibodies or convalescent plasma as part of COVID-19 treatment. Based on the estimated half-life of such therapies as well as evidence suggesting that reinfection is uncommon in the 90 days after initial infection, vaccination should be deferred for at least 90 days, as a precautionary measure until additional information becomes available, to avoid interference of the antibody treatment with vaccine-induced immune responses.  If you have any questions or concerns after the infusion please call the Advanced Practice Provider on call at [phone]. This number is only intended for your use regarding questions or concerns about the infusion post-treatment side-effects.  Please do not provide this number to others for use.   If someone you know is interested in receiving treatment please have them call the COVID hotline at [phone].   

## 2020-09-07 NOTE — Progress Notes (Signed)
Diagnosis: COVID-19  Physician: Dr. Patrick Wright  Procedure: [facility] Med: Sotrovimab infusion - Provided patient with sotrovimab fact sheet for patients, parents, and caregivers prior to infusion.   Complications: No immediate complications noted  Discharge: Discharged home    

## 2022-01-05 ENCOUNTER — Other Ambulatory Visit: Payer: Self-pay

## 2022-01-05 ENCOUNTER — Emergency Department (HOSPITAL_COMMUNITY)
Admission: EM | Admit: 2022-01-05 | Discharge: 2022-01-06 | Disposition: A | Discharge status: Home / Self Care | Payer: 59 | Attending: Emergency Medicine | Admitting: Emergency Medicine

## 2022-01-05 ENCOUNTER — Emergency Department (HOSPITAL_COMMUNITY): Payer: 59

## 2022-01-05 ENCOUNTER — Encounter (HOSPITAL_COMMUNITY): Payer: Self-pay | Admitting: *Deleted

## 2022-01-05 DIAGNOSIS — Z859 Personal history of malignant neoplasm, unspecified: Secondary | ICD-10-CM | POA: Insufficient documentation

## 2022-01-05 DIAGNOSIS — R079 Chest pain, unspecified: Secondary | ICD-10-CM | POA: Insufficient documentation

## 2022-01-05 DIAGNOSIS — Z7982 Long term (current) use of aspirin: Secondary | ICD-10-CM | POA: Diagnosis not present

## 2022-01-05 DIAGNOSIS — J45909 Unspecified asthma, uncomplicated: Secondary | ICD-10-CM | POA: Diagnosis not present

## 2022-01-05 DIAGNOSIS — R002 Palpitations: Secondary | ICD-10-CM | POA: Insufficient documentation

## 2022-01-05 LAB — BASIC METABOLIC PANEL
Anion gap: 8 (ref 5–15)
BUN: 12 mg/dL (ref 6–20)
CO2: 25 mmol/L (ref 22–32)
Calcium: 9.1 mg/dL (ref 8.9–10.3)
Chloride: 105 mmol/L (ref 98–111)
Creatinine, Ser: 0.75 mg/dL (ref 0.44–1.00)
GFR, Estimated: >60 mL/min (ref >60)
Glucose, Bld: 89 mg/dL (ref 70–99)
Potassium: 4.1 mmol/L (ref 3.5–5.1)
Sodium: 138 mmol/L (ref 135–145)

## 2022-01-05 LAB — CBC
HCT: 36.1 % (ref 36.0–46.0)
Hemoglobin: 12.5 g/dL (ref 12.0–15.0)
MCH: 30.1 pg (ref 26.0–34.0)
MCHC: 34.6 g/dL (ref 30.0–36.0)
MCV: 87 fL (ref 80.0–100.0)
Platelets: 257 10*3/uL (ref 150–400)
RBC: 4.15 MIL/uL (ref 3.87–5.11)
RDW: 13.5 % (ref 11.5–15.5)
WBC: 11.2 10*3/uL — ABNORMAL HIGH (ref 4.0–10.5)
nRBC: 0 % (ref 0.0–0.2)

## 2022-01-05 LAB — D-DIMER, QUANTITATIVE: D-Dimer, Quant: 0.36 ug/mL-FEU (ref 0.00–0.50)

## 2022-01-05 LAB — TSH: TSH: 1.877 u[IU]/mL (ref 0.350–4.500)

## 2022-01-05 LAB — I-STAT BETA HCG BLOOD, ED (MC, WL, AP ONLY): I-stat hCG, quantitative: 7.3 m[IU]/mL — ABNORMAL HIGH (ref <5)

## 2022-01-05 LAB — TROPONIN I (HIGH SENSITIVITY)
Troponin I (High Sensitivity): <2 ng/L (ref <18)
Troponin I (High Sensitivity): <2 ng/L (ref <18)

## 2022-01-05 LAB — MAGNESIUM: Magnesium: 2.1 mg/dL (ref 1.7–2.4)

## 2022-01-05 MED ORDER — ALUM & MAG HYDROXIDE-SIMETH 200-200-20 MG/5ML PO SUSP
30.0000 mL | Freq: Once | ORAL | Status: AC
  Administered 2022-01-05: 30 mL via ORAL
  Filled 2022-01-05: qty 30

## 2022-01-05 MED ORDER — SODIUM CHLORIDE 0.9 % IV BOLUS
1000.0000 mL | Freq: Once | INTRAVENOUS | Status: AC
  Administered 2022-01-05: 1000 mL via INTRAVENOUS

## 2022-01-05 MED ORDER — LIDOCAINE VISCOUS HCL 2 % MT SOLN
15.0000 mL | Freq: Once | OROMUCOSAL | Status: AC
  Administered 2022-01-05: 15 mL via ORAL
  Filled 2022-01-05: qty 15

## 2022-01-05 NOTE — ED Provider Notes (Signed)
?[facility] EMERGENCY DEPARTMENT ?Provider Note ? ? ?CSN: [id number] ?Arrival date & time: 01/05/22  1832 ? ?  ? ?History ? ?Chief Complaint  ?Patient presents with  ? Chest Pain  ? ? ?Alyssa Owen is a 36 y.o. female with a hx of asthma, migraines, and IBS who presents to the ED with complaints of palpitations x 3 days. Patient describes palpitations as rapid heart beat/tachycardia with associated dyspnea and chest pain. Pain is a burning sensation to the left chest that radiates to the shoulder and at times into the jaw/neck area. Occurring intermittently all throughout the day, pain and palpitations can occur separately. No specific triggers or alleviating/aggravating factors. At times feels nauseated with it and had a fever a few days ago but this resolved. Denies syncope,  leg pain/swelling, hemoptysis, recent surgery/trauma, recent long travel, hormone use, personal hx of cancer, or hx of DVT/PE. Had negative covid/flu testing. No recent major lifestyle changes, medication changes, or caffeine/sleep change.  ? ?HPI ? ?  ? ?Home Medications ?Prior to Admission medications   ?Medication Sig Start Date End Date Taking? Authorizing Provider  ?albuterol (PROVENTIL HFA;VENTOLIN HFA) 108 (90 Base) MCG/ACT inhaler Inhale 1-2 puffs into the lungs every 6 (six) hours as needed for wheezing or shortness of breath.    [provider]  ?aspirin-acetaminophen-caffeine (EXCEDRIN MIGRAINE) [phone] MG tablet Take 2-3 tablets by mouth every 6 (six) hours as needed for headache.     [provider]  ?budesonide-formoterol (SYMBICORT) 160-4.5 MCG/ACT inhaler Inhale 2 puffs into the lungs 2 (two) times daily. 04/17/18   [provider]  ?Dorise Hiss (AIMOVIG) 70 MG/ML SOAJ Inject 70 mg into the skin every 30 (thirty) days.    [provider]  ?meloxicam (MOBIC) 7.5 MG tablet Take 1 tablet (7.5 mg total) by mouth daily. 07/30/20   Kirsteins, Victorino Sparrow, MD  ?rizatriptan  (MAXALT-MLT) 10 MG disintegrating tablet Take 1 tablet (10 mg total) by mouth as needed for migraine. May repeat in 2 hours if needed 02/04/15   Nilda Riggs, NP  ?   ? ?Allergies    ?Bee venom, Other, Tomato, Cephalexin, Codeine, Gluten meal, Lactose intolerance (gi), Cephalosporins, Oxycodone, and Prednisone   ? ?Review of Systems   ?Review of Systems  ?Constitutional:  Positive for fever (resolved).  ?Respiratory:  Positive for shortness of breath.   ?Cardiovascular:  Positive for chest pain and palpitations. Negative for leg swelling.  ?Gastrointestinal:  Positive for nausea. Negative for abdominal pain, diarrhea and vomiting.  ?Neurological:  Negative for syncope.  ?All other systems reviewed and are negative. ? ?Physical Exam ?Updated Vital Signs ?BP 125/87   Pulse 92   Temp 98.2 ?F (36.8 ?C)   Resp 18   Ht 5\' 9"  (1.753 m)   Wt 113.7 kg   LMP 11/12/2014   SpO2 98%   BMI 37.02 kg/m?  ?Physical Exam ?Vitals and nursing note reviewed.  ?Constitutional:   ?   General: She is not in acute distress. ?   Appearance: She is well-developed. She is not toxic-appearing.  ?HENT:  ?   Head: Normocephalic and atraumatic.  ?Eyes:  ?   General:     ?   Right eye: No discharge.     ?   Left eye: No discharge.  ?   Conjunctiva/sclera: Conjunctivae normal.  ?Cardiovascular:  ?   Rate and Rhythm: Normal rate and regular rhythm.  ?   Pulses:     ?  Radial pulses are 2+ on the right side and 2+ on the left side.  ?     Dorsalis pedis pulses are 2+ on the right side and 2+ on the left side.  ?     Posterior tibial pulses are 2+ on the right side and 2+ on the left side.  ?Pulmonary:  ?   Effort: No respiratory distress.  ?   Breath sounds: Normal breath sounds. No wheezing or rales.  ?Chest:  ?   Chest wall: No tenderness.  ?Abdominal:  ?   General: There is no distension.  ?   Palpations: Abdomen is soft.  ?   Tenderness: There is no abdominal tenderness.  ?Musculoskeletal:  ?   Cervical back: Neck supple.  ?    Right lower leg: No tenderness. No edema.  ?   Left lower leg: No tenderness. No edema.  ?Skin: ?   General: Skin is warm and dry.  ?Neurological:  ?   Mental Status: She is alert.  ?   Comments: Clear speech.   ?Psychiatric:     ?   Behavior: Behavior normal.  ? ? ?ED Results / Procedures / Treatments   ?Labs ?(all labs ordered are listed, but only abnormal results are displayed) ?Labs Reviewed  ?CBC - Abnormal; Notable for the following components:  ?    Result Value  ? WBC 11.2 (*)   ? All other components within normal limits  ?I-STAT BETA HCG BLOOD, ED ([facility], [facility], [facility] ONLY) - Abnormal; Notable for the following components:  ? I-stat hCG, quantitative 7.3 (*)   ? All other components within normal limits  ?BASIC METABOLIC PANEL  ?MAGNESIUM  ?TSH  ?D-DIMER, QUANTITATIVE  ?TROPONIN I (HIGH SENSITIVITY)  ?TROPONIN I (HIGH SENSITIVITY)  ? ? ?EKG ?None ? ?Radiology ?DG Chest 1 View ? ?Result Date: 01/05/2022 ?CLINICAL DATA:  Chest pain, palpitations, left-sided pain radiating to neck EXAM: CHEST  1 VIEW COMPARISON:  12/10/2017 FINDINGS: Single frontal view of the chest demonstrates an unremarkable cardiac silhouette. No acute airspace disease, effusion, or pneumothorax. No acute bony abnormalities. IMPRESSION: 1. Stable chest, no acute process. Electronically Signed   By: Sharlet SalinaMichael  Skillman M.D.   On: 01/05/2022 19:56   ? ?Procedures ?Procedures  ? ? ?Medications Ordered in ED ?Medications  ?sodium chloride 0.9 % bolus 1,000 mL (has no administration in time range)  ?alum & mag hydroxide-simeth (MAALOX/MYLANTA) 200-200-20 MG/5ML suspension 30 mL (has no administration in time range)  ?  And  ?lidocaine (XYLOCAINE) 2 % viscous mouth solution 15 mL (has no administration in time range)  ? ? ?ED Course/ Medical Decision Making/ A&P ?  ?                        ?Medical Decision Making ?Amount and/or Complexity of Data Reviewed ?Labs: ordered. ? ?Risk ?OTC drugs. ?Prescription drug management. ? ? ?Patient presents to the ED with  complaints of palpitations & chest pain, this involves an extensive number of treatment options, and is a complaint that carries with it a high risk of complications and morbidity. Nontoxic, vitals without significant abnormality.  ? ?DDX including but not limited to: ACS, pulmonary embolism, dissection, pneumothorax, pneumonia, arrhythmia, severe anemia, MSK, GERD, anxiety..  ? ?Additional history obtained:  ?Chart & nursing note reviewed.  ? ?EKG: NSR ? ?Lab Tests:  ?I reviewed & interpreted labs including:  ?CBC: mild leukocytosis, otherwise unremarkable.  ?BMP: unremarkable. ?Magnesium: WNL ?TSH: WNL ?D-dimer: WNL ?  Troponins: Normal & flat ?I-stat hcg: likely false elevation- S/p tubal & hysterectomy  ? ?Imaging Studies ordered:  ?I  viewed the following imaging, agree with radiologist impression:  ?1. Stable chest, no acute process ? ?ED Course:  ? ?Heart Pathway Score low risk- EKG without obvious acute ischemia, delta troponin negative, doubt ACS. Patient is low risk wells, ddimer negative, doubt pulmonary embolism. Pain is not a tearing sensation, symmetric pulses, no widening of mediastinum on CXR, doubt dissection. Cardiac monitor reviewed, no notable arrhythmias or tachycardia - patient has remained in sinus rhythm throughout ED visit.  No critical electrolyte derangement, thyroid derangement, or anemia.  Overall reassuring ED work-up.  Patient seems reasonable for discharge with close outpatient primary care and cardiology follow-up. I discussed results, treatment plan, need for follow-up, and return precautions with the patient. Provided opportunity for questions, patient confirmed understanding and is in agreement with plan.  ? ? ?Based on patient's chief complaint, I considered admission might be necessary, however after reassuring ED workup feel patient is reasonable for discharge.  ? ?Portions of this note were generated with Scientist, clinical (histocompatibility and immunogenetics). Dictation errors may occur despite best  attempts at proofreading. ? .  ? ? ?Final Clinical Impression(s) / ED Diagnoses ?Final diagnoses:  ?Palpitations  ?Chest pain, unspecified type  ? ? ?Rx / DC Orders ?ED Discharge Orders   ? ? None  ? ?  ? ? ?  ?Sharbel Sahagun, S

## 2022-01-05 NOTE — ED Provider Triage Note (Signed)
Emergency Medicine Provider Triage Evaluation Note ? ?Alyssa Owen , a 36 y.o. female  was evaluated in triage.  Pt complains of chest pain and palpitations. She states that over the past two days, she has been getting sharp chest pains in the left side of chest that radiate up into her neck. She has associated palpitations and feelings that her heart is racing. She also has associated shortness of breath. She states that she had a fever to 102 two days ago and has been having chills. She denies any other URI symptoms. ? ?Review of Systems  ?Positive: Chest pain, palpitations, fever, shortness of breath, chills ?Negative:  ? ?Physical Exam  ?BP 125/87   Pulse 92   Temp 98.2 ?F (36.8 ?C)   Resp 18   Ht 5\' 9"  (1.753 m)   Wt 113.7 kg   LMP 11/12/2014   SpO2 98%   BMI 37.02 kg/m?  ?Gen:   Awake, no distress   ?Resp:  Normal effort  ?MSK:   Moves extremities without difficulty  ?Other:  Lungs clear. Feels warm to the touch. Heart RRR. No murmurs ? ?Medical Decision Making  ?Medically screening exam initiated at 7:21 PM.  Appropriate orders placed.  Alyssa Owen was informed that the remainder of the evaluation will be completed by another provider, this initial triage assessment does not replace that evaluation, and the importance of remaining in the ED until their evaluation is complete. ? ? ?  ?Saundra Shelling, PA-C ?01/05/22 1923 ? ?

## 2022-01-05 NOTE — ED Triage Notes (Signed)
The pt is c/o chest pain for 2 days and she had an elevated temp  2 days ago none now neg for covid and  flu mid chest pain with lt bruning pain toward her lt shoulder  no known history or injury  lmp   none hyst ?

## 2022-01-06 NOTE — Discharge Instructions (Addendum)
You were seen in the emergency department today for palpitations and chest pain. Your work-up in the emergency department has been overall reassuring. Your labs have been fairly normal, your white blood cell count was mildly elevated. Your EKG and the enzyme we use to check your heart did not show an acute heart attack at this time. Your chest x-ray was normal. Your ddimer test (to look for a blood clot) was negative.   ? ?We would like you to follow up closely with your primary care provider and/or the cardiologist provided in your discharge instructions within 1-3 days. Return to the ER immediately should you experience any new or worsening symptoms including but not limited to return of pain, worsened pain, vomiting, shortness of breath, dizziness, lightheadedness, passing out, or any other concerns that you may have.   ? ?Below are your results from your ER visit today.  ? ?Results for orders placed or performed during the hospital encounter of 01/05/22  ?Basic metabolic panel  ?Result Value Ref Range  ? Sodium 138 135 - 145 mmol/L  ? Potassium 4.1 3.5 - 5.1 mmol/L  ? Chloride 105 98 - 111 mmol/L  ? CO2 25 22 - 32 mmol/L  ? Glucose, Bld 89 70 - 99 mg/dL  ? BUN 12 6 - 20 mg/dL  ? Creatinine, Ser 0.75 0.44 - 1.00 mg/dL  ? Calcium 9.1 8.9 - 10.3 mg/dL  ? GFR, Estimated >60 >60 mL/min  ? Anion gap 8 5 - 15  ?CBC  ?Result Value Ref Range  ? WBC 11.2 (H) 4.0 - 10.5 K/uL  ? RBC 4.15 3.87 - 5.11 MIL/uL  ? Hemoglobin 12.5 12.0 - 15.0 g/dL  ? HCT 36.1 36.0 - 46.0 %  ? MCV 87.0 80.0 - 100.0 fL  ? MCH 30.1 26.0 - 34.0 pg  ? MCHC 34.6 30.0 - 36.0 g/dL  ? RDW 13.5 11.5 - 15.5 %  ? Platelets 257 150 - 400 K/uL  ? nRBC 0.0 0.0 - 0.2 %  ?Magnesium  ?Result Value Ref Range  ? Magnesium 2.1 1.7 - 2.4 mg/dL  ?TSH  ?Result Value Ref Range  ? TSH 1.877 0.350 - 4.500 uIU/mL  ?D-dimer, quantitative  ?Result Value Ref Range  ? D-Dimer, Quant 0.36 0.00 - 0.50 ug/mL-FEU  ?I-Stat beta hCG blood, ED  ?Result Value Ref Range  ? I-stat hCG,  quantitative 7.3 (H) <5 mIU/mL  ? Comment 3          ?Troponin I (High Sensitivity)  ?Result Value Ref Range  ? Troponin I (High Sensitivity) <2 <18 ng/L  ?Troponin I (High Sensitivity)  ?Result Value Ref Range  ? Troponin I (High Sensitivity) <2 <18 ng/L  ? ?DG Chest 1 View ? ?Result Date: 01/05/2022 ?CLINICAL DATA:  Chest pain, palpitations, left-sided pain radiating to neck EXAM: CHEST  1 VIEW COMPARISON:  12/10/2017 FINDINGS: Single frontal view of the chest demonstrates an unremarkable cardiac silhouette. No acute airspace disease, effusion, or pneumothorax. No acute bony abnormalities. IMPRESSION: 1. Stable chest, no acute process. Electronically Signed   By: Sharlet Salina M.D.   On: 01/05/2022 19:56   ? ?

## 2022-01-06 NOTE — ED Notes (Signed)
Patient verbalizes understanding of discharge instructions. Opportunity for questioning and answers were provided. Armband removed by staff, pt discharged from ED.  

## 2022-09-25 ENCOUNTER — Encounter (HOSPITAL_BASED_OUTPATIENT_CLINIC_OR_DEPARTMENT_OTHER): Payer: Self-pay | Admitting: Emergency Medicine

## 2022-09-25 ENCOUNTER — Other Ambulatory Visit: Payer: Self-pay

## 2022-09-25 ENCOUNTER — Emergency Department (HOSPITAL_BASED_OUTPATIENT_CLINIC_OR_DEPARTMENT_OTHER)
Admission: EM | Admit: 2022-09-25 | Discharge: 2022-09-25 | Disposition: A | Discharge status: Home / Self Care | Payer: 59 | Attending: Emergency Medicine | Admitting: Emergency Medicine

## 2022-09-25 DIAGNOSIS — J45909 Unspecified asthma, uncomplicated: Secondary | ICD-10-CM | POA: Diagnosis not present

## 2022-09-25 DIAGNOSIS — M545 Low back pain, unspecified: Secondary | ICD-10-CM | POA: Insufficient documentation

## 2022-09-25 LAB — URINALYSIS, ROUTINE W REFLEX MICROSCOPIC
Bilirubin Urine: NEGATIVE
Glucose, UA: NEGATIVE mg/dL
Hgb urine dipstick: NEGATIVE
Ketones, ur: NEGATIVE mg/dL
Leukocytes,Ua: NEGATIVE
Nitrite: NEGATIVE
Protein, ur: NEGATIVE mg/dL
Specific Gravity, Urine: >=1.03 (ref 1.005–1.030)
pH: 5.5 (ref 5.0–8.0)

## 2022-09-25 LAB — PREGNANCY, URINE: Preg Test, Ur: NEGATIVE

## 2022-09-25 MED ORDER — DIAZEPAM 5 MG/ML IJ SOLN
5.0000 mg | Freq: Once | INTRAMUSCULAR | Status: AC
  Administered 2022-09-25: 5 mg via INTRAMUSCULAR
  Filled 2022-09-25: qty 2

## 2022-09-25 MED ORDER — DICLOFENAC SODIUM 1 % EX GEL: 2.0000 g | Freq: Four times a day (QID) | CUTANEOUS | 0 refills | Status: AC | PRN

## 2022-09-25 MED ORDER — DEXAMETHASONE SODIUM PHOSPHATE 10 MG/ML IJ SOLN
10.0000 mg | Freq: Once | INTRAMUSCULAR | Status: AC
  Administered 2022-09-25: 10 mg via INTRAMUSCULAR
  Filled 2022-09-25: qty 1

## 2022-09-25 MED ORDER — METHOCARBAMOL 500 MG PO TABS: 500.0000 mg | ORAL_TABLET | Freq: Three times a day (TID) | ORAL | 0 refills | Status: DC | PRN

## 2022-09-25 NOTE — ED Triage Notes (Signed)
Pt arrives pov, c/o lower back pain x 4 days, denies injury. Pain alleviated on Friday, returned on Sat when getting in car. Reports taking aleve x 2 hrs pta

## 2022-09-25 NOTE — ED Provider Notes (Signed)
Emergency Department Provider Note   I have reviewed the triage vital signs and the nursing notes.   HISTORY  Chief Complaint Back Pain   HPI Alyssa Owen is a 36 y.o. female presents to the ED with 4 days of lower back pain. No injury. No numbness/weakness. No urinary symptoms. No flank pain. She tried OTC meds with some relief but then symptoms returned.    Past Medical History:  Diagnosis Date   Asthma    IBS (irritable bowel syndrome)    Migraine     Review of Systems  Constitutional: No fever/chills Eyes: No visual changes. ENT: No sore throat. Cardiovascular: Denies chest pain. Respiratory: Denies shortness of breath. Gastrointestinal: No abdominal pain.  No nausea, no vomiting.  No diarrhea.  No constipation. Genitourinary: Negative for dysuria. Musculoskeletal: Positive for back pain. Skin: Negative for rash. Neurological: Negative for headaches, focal weakness or numbness.  ____________________________________________   PHYSICAL EXAM:  VITAL SIGNS: ED Triage Vitals  Enc Vitals Group     BP 09/25/22 1208 (!) 113/58     Pulse Rate 09/25/22 1208 77     Resp 09/25/22 1208 18     Temp 09/25/22 1208 97.9 F (36.6 C)     Temp Source 09/25/22 1208 Oral     SpO2 09/25/22 1208 100 %   Constitutional: Alert and oriented. Well appearing and in no acute distress. Eyes: Conjunctivae are normal.  Head: Atraumatic. Nose: No congestion/rhinnorhea. Mouth/Throat: Mucous membranes are moist.  Neck: No stridor.  Cardiovascular: Normal rate, regular rhythm. Good peripheral circulation. Grossly normal heart sounds.   Respiratory: Normal respiratory effort.  No retractions. Lungs CTAB. Gastrointestinal: Soft and nontender. No distention.  Musculoskeletal: No lower extremity tenderness nor edema. No gross deformities of extremities. Neurologic:  Normal speech and language. No gross focal neurologic deficits are appreciated.  Skin:  Skin is warm, dry and intact. No  rash noted.  ____________________________________________   LABS (all labs ordered are listed, but only abnormal results are displayed)  Labs Reviewed  URINALYSIS, ROUTINE W REFLEX MICROSCOPIC  PREGNANCY, URINE    ____________________________________________   PROCEDURES  Procedure(s) performed:   Procedures  None ____________________________________________   INITIAL IMPRESSION / ASSESSMENT AND PLAN / ED COURSE  Pertinent labs & imaging results that were available during my care of the patient were reviewed by me and considered in my medical decision making (see chart for details).   This patient is Presenting for Evaluation of back pain, which does require a range of treatment options, and is a complaint that involves a high risk of morbidity and mortality.  The Differential Diagnoses includes but is not exclusive to musculoskeletal back pain, renal colic, urinary tract infection, pyelonephritis, intra-abdominal causes of back pain, aortic aneurysm or dissection, cauda equina syndrome, sciatica, lumbar disc disease, thoracic disc disease, etc.   Critical Interventions-    Medications  diazepam (VALIUM) injection 5 mg (5 mg Intramuscular Given 09/25/22 1246)  dexamethasone (DECADRON) injection 10 mg (10 mg Intramuscular Given 09/25/22 1245)    Reassessment after intervention: Pain improved.     Clinical Laboratory Tests Ordered, included UA and pregnancy without infection or pregnancy.   Radiologic Tests: Considered back imaging but reassuring exam and no red-flag signs/symptoms to prompt imaging.   Social Determinants of Health Risk patient denies IVDA.  Medical Decision Making: Summary:  Patient with likely MSK back pain. No neuro findings.   Reevaluation with update and discussion with patient. Plan for pain mgmt and spine follow up. Patient is  established with spine provider at this time.    Patient's presentation is most consistent with exacerbation of  chronic illness.   Disposition: discharge  ____________________________________________  FINAL CLINICAL IMPRESSION(S) / ED DIAGNOSES  Final diagnoses:  Acute bilateral low back pain without sciatica     NEW OUTPATIENT MEDICATIONS STARTED DURING THIS VISIT:  Discharge Medication List as of 09/25/2022  1:37 PM     START taking these medications   Details  diclofenac Sodium (VOLTAREN) 1 % GEL Apply 2 g topically 4 (four) times daily as needed., Starting Sun 09/25/2022, Normal    methocarbamol (ROBAXIN) 500 MG tablet Take 1 tablet (500 mg total) by mouth every 8 (eight) hours as needed for muscle spasms., Starting Sun 09/25/2022, Normal        Note:  This document was prepared using Dragon voice recognition software and may include unintentional dictation errors.  Nanda Quinton, MD, [facility] Emergency Medicine    Jacobb Alen, [facility], MD 10/02/22 [id number]

## 2022-09-25 NOTE — Discharge Instructions (Signed)

## 2023-01-21 ENCOUNTER — Encounter (HOSPITAL_BASED_OUTPATIENT_CLINIC_OR_DEPARTMENT_OTHER): Payer: Self-pay

## 2023-01-21 ENCOUNTER — Emergency Department (HOSPITAL_BASED_OUTPATIENT_CLINIC_OR_DEPARTMENT_OTHER)
Admission: EM | Admit: 2023-01-21 | Discharge: 2023-01-21 | Discharge status: Against Medical Advice | Payer: 59 | Attending: Emergency Medicine | Admitting: Emergency Medicine

## 2023-01-21 ENCOUNTER — Other Ambulatory Visit: Payer: Self-pay

## 2023-01-21 ENCOUNTER — Emergency Department (HOSPITAL_BASED_OUTPATIENT_CLINIC_OR_DEPARTMENT_OTHER): Payer: 59

## 2023-01-21 DIAGNOSIS — R0789 Other chest pain: Secondary | ICD-10-CM | POA: Diagnosis not present

## 2023-01-21 DIAGNOSIS — Z5321 Procedure and treatment not carried out due to patient leaving prior to being seen by health care provider: Secondary | ICD-10-CM | POA: Insufficient documentation

## 2023-01-21 HISTORY — DX: Malignant neoplasm of cervix uteri, unspecified: C53.9

## 2023-01-21 LAB — BASIC METABOLIC PANEL
Anion gap: 7 (ref 5–15)
BUN: 10 mg/dL (ref 6–20)
CO2: 26 mmol/L (ref 22–32)
Calcium: 8.9 mg/dL (ref 8.9–10.3)
Chloride: 106 mmol/L (ref 98–111)
Creatinine, Ser: 0.71 mg/dL (ref 0.44–1.00)
GFR, Estimated: >60 mL/min (ref >60)
Glucose, Bld: 92 mg/dL (ref 70–99)
Potassium: 4.3 mmol/L (ref 3.5–5.1)
Sodium: 139 mmol/L (ref 135–145)

## 2023-01-21 LAB — CBC
HCT: 35.2 % — ABNORMAL LOW (ref 36.0–46.0)
Hemoglobin: 12.4 g/dL (ref 12.0–15.0)
MCH: 30.3 pg (ref 26.0–34.0)
MCHC: 35.2 g/dL (ref 30.0–36.0)
MCV: 86.1 fL (ref 80.0–100.0)
Platelets: 238 10*3/uL (ref 150–400)
RBC: 4.09 MIL/uL (ref 3.87–5.11)
RDW: 13.8 % (ref 11.5–15.5)
WBC: 12.2 10*3/uL — ABNORMAL HIGH (ref 4.0–10.5)
nRBC: 0 % (ref 0.0–0.2)

## 2023-01-21 LAB — TROPONIN I (HIGH SENSITIVITY): Troponin I (High Sensitivity): <2 ng/L (ref <18)

## 2023-01-21 NOTE — ED Triage Notes (Signed)
Patient arrives POV c/o left-to mid chest pain that started 2 days ago. Patient states chest pain feels like a burning and squeezing pain that is 6/10; Patient states chest pain started to radiate down left arm and in between shoulder blades this evening. Patient states she took medication for indigestion with no relief.

## 2023-01-23 ENCOUNTER — Emergency Department (HOSPITAL_COMMUNITY)
Admission: EM | Admit: 2023-01-23 | Discharge: 2023-01-23 | Disposition: A | Discharge status: Home / Self Care | Payer: 59 | Attending: Emergency Medicine | Admitting: Emergency Medicine

## 2023-01-23 ENCOUNTER — Other Ambulatory Visit: Payer: Self-pay

## 2023-01-23 DIAGNOSIS — J45909 Unspecified asthma, uncomplicated: Secondary | ICD-10-CM | POA: Insufficient documentation

## 2023-01-23 DIAGNOSIS — R0602 Shortness of breath: Secondary | ICD-10-CM | POA: Insufficient documentation

## 2023-01-23 DIAGNOSIS — R079 Chest pain, unspecified: Secondary | ICD-10-CM

## 2023-01-23 DIAGNOSIS — R072 Precordial pain: Secondary | ICD-10-CM | POA: Insufficient documentation

## 2023-01-23 LAB — CBC
HCT: 35.4 % — ABNORMAL LOW (ref 36.0–46.0)
Hemoglobin: 12 g/dL (ref 12.0–15.0)
MCH: 29.6 pg (ref 26.0–34.0)
MCHC: 33.9 g/dL (ref 30.0–36.0)
MCV: 87.4 fL (ref 80.0–100.0)
Platelets: 233 10*3/uL (ref 150–400)
RBC: 4.05 MIL/uL (ref 3.87–5.11)
RDW: 14 % (ref 11.5–15.5)
WBC: 9.1 10*3/uL (ref 4.0–10.5)
nRBC: 0 % (ref 0.0–0.2)

## 2023-01-23 LAB — COMPREHENSIVE METABOLIC PANEL
ALT: 13 U/L (ref 0–44)
AST: 17 U/L (ref 15–41)
Albumin: 3.6 g/dL (ref 3.5–5.0)
Alkaline Phosphatase: 48 U/L (ref 38–126)
Anion gap: 7 (ref 5–15)
BUN: 6 mg/dL (ref 6–20)
CO2: 27 mmol/L (ref 22–32)
Calcium: 9.3 mg/dL (ref 8.9–10.3)
Chloride: 106 mmol/L (ref 98–111)
Creatinine, Ser: 0.72 mg/dL (ref 0.44–1.00)
GFR, Estimated: >60 mL/min (ref >60)
Glucose, Bld: 86 mg/dL (ref 70–99)
Potassium: 3.9 mmol/L (ref 3.5–5.1)
Sodium: 140 mmol/L (ref 135–145)
Total Bilirubin: 0.6 mg/dL (ref 0.3–1.2)
Total Protein: 6.6 g/dL (ref 6.5–8.1)

## 2023-01-23 LAB — TROPONIN I (HIGH SENSITIVITY)
Troponin I (High Sensitivity): <2 ng/L (ref <18)
Troponin I (High Sensitivity): <2 ng/L (ref <18)

## 2023-01-23 MED ORDER — ALUM & MAG HYDROXIDE-SIMETH 200-200-20 MG/5ML PO SUSP
30.0000 mL | Freq: Once | ORAL | Status: AC
  Administered 2023-01-23: 30 mL via ORAL
  Filled 2023-01-23: qty 30

## 2023-01-23 MED ORDER — PANTOPRAZOLE SODIUM 20 MG PO TBEC
20.0000 mg | DELAYED_RELEASE_TABLET | Freq: Every day | ORAL | Status: DC
  Administered 2023-01-23: 20 mg via ORAL
  Filled 2023-01-23: qty 1

## 2023-01-23 MED ORDER — PANTOPRAZOLE SODIUM 20 MG PO TBEC: 20.0000 mg | DELAYED_RELEASE_TABLET | Freq: Every day | ORAL | 0 refills | Status: AC

## 2023-01-23 NOTE — ED Provider Notes (Signed)
[place] EMERGENCY DEPARTMENT AT [facility] Provider Note   CSN: [id number] Arrival date & time: 01/23/23  1317     History  Chief Complaint  Patient presents with   Chest Pain    Alyssa Owen is a 37 y.o. female with medical history of asthma, IBS, migraines.  Patient presents to ED for evaluation of shortness of breath and chest pain.  Patient reports that ever since Wednesday she has had centralized, left-sided chest pain that is worse with exertion and associated with shortness of breath.  Patient states that she attempted to control the symptoms utilizing omeprazole without relief.  Patient reports that she was seen for the same complaint on Friday at [facility] however after triage left without being seen due to wait time.  Patient states that since this time she has had persistent chest pain located about her sternum as well as the left side of her chest.  She denies radiation.  Patient states that today she was at a doctor's appointment with her daughter and began having bilateral tingling in her shoulders as well as generalized weakness and decided to come back in for evaluation.  The patient denies any fevers, abdominal pain, nausea, vomiting, back pain, leg swelling, syncope.  Patient denies recent surgery or travel, hormone use, history of DVT/PE, one-sided leg swelling, hemoptysis.  Patient was offered D-dimer for reassurance however deferred this stating that she had "been stuck too many times".   Chest Pain Associated symptoms: shortness of breath        Home Medications Prior to Admission medications   Medication Sig Start Date End Date Taking? Authorizing Provider  pantoprazole (PROTONIX) 20 MG tablet Take 1 tablet (20 mg total) by mouth daily. 01/23/23  Yes Al Decant, PA-C  albuterol (PROVENTIL HFA;VENTOLIN HFA) 108 (90 Base) MCG/ACT inhaler Inhale 1-2 puffs into the lungs every 6 (six) hours as needed for wheezing or shortness of breath.     [provider]  aspirin-acetaminophen-caffeine (EXCEDRIN MIGRAINE) [phone] MG tablet Take 2-3 tablets by mouth every 6 (six) hours as needed for headache.     [provider]  budesonide-formoterol (SYMBICORT) 160-4.5 MCG/ACT inhaler Inhale 2 puffs into the lungs 2 (two) times daily. 04/17/18   [provider]  diclofenac Sodium (VOLTAREN) 1 % GEL Apply 2 g topically 4 (four) times daily as needed. 09/25/22   Long, Arlyss Repress, MD  Erenumab-aooe (AIMOVIG) 70 MG/ML SOAJ Inject 70 mg into the skin every 30 (thirty) days.    [provider]  meloxicam (MOBIC) 7.5 MG tablet Take 1 tablet (7.5 mg total) by mouth daily. 07/30/20   Kirsteins, Victorino Sparrow, MD  methocarbamol (ROBAXIN) 500 MG tablet Take 1 tablet (500 mg total) by mouth every 8 (eight) hours as needed for muscle spasms. 09/25/22   Long, Arlyss Repress, MD  rizatriptan (MAXALT-MLT) 10 MG disintegrating tablet Take 1 tablet (10 mg total) by mouth as needed for migraine. May repeat in 2 hours if needed 02/04/15   Nilda Riggs, NP      Allergies    Bee venom, Other, Tomato, Cephalexin, Codeine, Gluten meal, Lactose intolerance (gi), Sulfa antibiotics, Cephalosporins, Oxycodone, and Prednisone    Review of Systems   Review of Systems  Respiratory:  Positive for shortness of breath.   Cardiovascular:  Positive for chest pain.  All other systems reviewed and are negative.   Physical Exam Updated Vital Signs BP 119/78 (BP Location: Right Arm)   Pulse (!) 58  Temp 98.6 F (37 C) (Oral)   Resp 18   LMP 11/12/2014   SpO2 100%  Physical Exam Vitals and nursing note reviewed.  Constitutional:      General: She is not in acute distress.    Appearance: Normal appearance. She is not ill-appearing, toxic-appearing or diaphoretic.  HENT:     Head: Normocephalic and atraumatic.     Nose: Nose normal.     Mouth/Throat:     Mouth: Mucous membranes are moist.     Pharynx: Oropharynx is clear.  Eyes:      Extraocular Movements: Extraocular movements intact.     Conjunctiva/sclera: Conjunctivae normal.     Pupils: Pupils are equal, round, and reactive to light.  Cardiovascular:     Rate and Rhythm: Normal rate and regular rhythm.  Pulmonary:     Effort: Pulmonary effort is normal.     Breath sounds: Normal breath sounds. No wheezing.  Abdominal:     General: Abdomen is flat. Bowel sounds are normal.     Palpations: Abdomen is soft.     Tenderness: There is no abdominal tenderness.  Musculoskeletal:     Cervical back: Normal range of motion and neck supple. No tenderness.  Skin:    General: Skin is warm and dry.     Capillary Refill: Capillary refill takes less than 2 seconds.  Neurological:     General: No focal deficit present.     Mental Status: She is alert and oriented to person, place, and time.     GCS: GCS eye subscore is 4. GCS verbal subscore is 5. GCS motor subscore is 6.     Cranial Nerves: Cranial nerves 2-12 are intact. No cranial nerve deficit.     Sensory: Sensation is intact. No sensory deficit.     Motor: Motor function is intact. No weakness.     ED Results / Procedures / Treatments   Labs (all labs ordered are listed, but only abnormal results are displayed) Labs Reviewed  CBC - Abnormal; Notable for the following components:      Result Value   HCT 35.4 (*)    All other components within normal limits  COMPREHENSIVE METABOLIC PANEL  TROPONIN I (HIGH SENSITIVITY)  TROPONIN I (HIGH SENSITIVITY)    EKG EKG Interpretation  Date/Time:  Monday January 23 2023 13:30:18 EDT Ventricular Rate:  76 PR Interval:  170 QRS Duration: 94 QT Interval:  358 QTC Calculation: 402 R Axis:   71 Text Interpretation: Normal sinus rhythm  no acute ST/T changes or significant change since 2 days ago Confirmed by Pricilla Loveless [phone]) on 01/23/2023 4:26:16 PM  Radiology No results found.  Procedures Procedures   Medications Ordered in ED Medications  pantoprazole  (PROTONIX) EC tablet 20 mg (20 mg Oral Given 01/23/23 1809)  alum & mag hydroxide-simeth (MAALOX/MYLANTA) 200-200-20 MG/5ML suspension 30 mL (30 mLs Oral Given 01/23/23 1809)    ED Course/ Medical Decision Making/ A&P  Medical Decision Making Amount and/or Complexity of Data Reviewed Labs: ordered.   37 year old female presents to the ED for evaluation.  Please see HPI for further details.  On my examination the patient is afebrile, nontachycardic.  Her lung sounds are clear bilaterally, she is not hypoxic.  Her abdomen is soft and compressible throughout.  No CVA tenderness bilaterally.  Her neurological examination is at baseline without focal neurodeficits.  She is alert and oriented x 3.  She is overall nontoxic in appearance.  Her chest pain is not reproducible  on palpation.  Patient deferred chest x-ray, states that she had chest x-ray 2 days ago.  Chest x-ray reviewed, no consolidations or effusions or other abnormalities noted.  EKG nonischemic, unchanged from EKG 2 days ago.  Troponins both less than 2.  CBC without cytosis or anemia.  CMP without electrolyte derangement.  Patient is PERC criteria negative.  No hemoptysis, unilateral leg swelling, tachycardia, recent surgery or travel, history of DVT/PE, exogenous hormone use, tachypnea.  At this time, based on lab and imaging results, I doubt ACS, PE, pneumothorax, pneumonia.  Patient given Protonix, GI cocktail here and patient reports that burning sensation in chest has slightly decreased at this time, patient symptoms could be secondary to GERD.    Patient will be sent home with prescription of pantoprazole and advised to follow-up with her PCP.  Patient was given return precautions and she voiced understanding.  Patient will also be given contact information for cardiology, she can make an appointment for further management/workup if she feels the need to do so.  Patient had all her questions answered.  Patient discharged in stable  condition.   Final Clinical Impression(s) / ED Diagnoses Final diagnoses:  Chest pain, unspecified type    Rx / DC Orders ED Discharge Orders          Ordered    pantoprazole (PROTONIX) 20 MG tablet  Daily        01/23/23 1847              Al Decant, PA-C 01/23/23 Raymondo Band, MD 01/24/23 [phone]

## 2023-01-23 NOTE — ED Triage Notes (Addendum)
Patient here for evaluation of intermittent shortness of breath and weakness that started several days ago, seen at [facility] for same on Friday but left without being seen after normal lab results. Patient she has tried multiple OTC medications for indigestion with no relief in symptoms. Patient states then today she started feeling dizzy and both arms sudden felt heavy so she came in for evaluation. Patient is alert, oriented, and in no apparent distress at this time.

## 2023-01-23 NOTE — Discharge Instructions (Signed)
Please return to the ED with any new or worsening signs or symptoms Please follow-up with your PCP for further management of your chest discomfort Please begin taking pantoprazole 20 mg once a day Please see attached information for cardiology referral

## 2023-01-23 NOTE — ED Provider Triage Note (Signed)
Emergency Medicine Provider Triage Evaluation Note  Alyssa Owen , a 37 y.o. female  was evaluated in triage.  Pt complains of intermittent chest pain, shortness of, generalized weakness, and palpitations since last week.  States that she went to med [facility] for evaluation 3 days ago but left without being seen after being triaged as "I clearly was not having a heart attack since they put me back in the lobby so I left. "  She is continue to have symptoms since that time.  History of childhood heart murmur but no other heart problems.  Only other medical history is asthma which is well-controlled.  Unclear family history of any heart disease.  She denies leg pain or swelling.  She denies recent travel, recent surgery, or history of DVT/PE.  She states "I requested a D-dimer at [facility] but they denied this."  Review of Systems  Positive: See HPI Negative: See HPI  Physical Exam  BP 116/61 (BP Location: Left Arm)   Pulse 86   Temp 98.1 F (36.7 C) (Oral)   Resp 17   LMP 11/12/2014   SpO2 100%  Gen:   Awake, no distress   Resp:  Normal effort lungs clear to auscultation MSK:   Moves extremities without difficulty no lower extremity edema Other:  Regular rate and rhythm, patient neurologically intact  Medical Decision Making  Medically screening exam initiated at 2:15 PM.  Appropriate orders placed.  Sadaya Bessette was informed that the remainder of the evaluation will be completed by another provider, this initial triage assessment does not replace that evaluation, and the importance of remaining in the ED until their evaluation is complete.  Chest pain workup initiated in triage.  Will not order a D-dimer as not indicated through [facility] and will allow for discussion with primary provider once patient is in ED room.  Patient with oxygen saturation 100% on room air, no signs of respiratory distress.  No tachycardia.  No identifiable risk factors.  Patient declined chest x-ray.   Tonette Lederer, PA-C 01/23/23 1418

## 2023-04-21 ENCOUNTER — Other Ambulatory Visit: Payer: Self-pay

## 2023-05-23 ENCOUNTER — Emergency Department (HOSPITAL_BASED_OUTPATIENT_CLINIC_OR_DEPARTMENT_OTHER): Payer: 59

## 2023-05-23 ENCOUNTER — Emergency Department (HOSPITAL_BASED_OUTPATIENT_CLINIC_OR_DEPARTMENT_OTHER)
Admission: EM | Admit: 2023-05-23 | Discharge: 2023-05-23 | Disposition: A | Discharge status: Home / Self Care | Payer: 59 | Attending: Emergency Medicine | Admitting: Emergency Medicine

## 2023-05-23 ENCOUNTER — Encounter (HOSPITAL_BASED_OUTPATIENT_CLINIC_OR_DEPARTMENT_OTHER): Payer: Self-pay | Admitting: Emergency Medicine

## 2023-05-23 ENCOUNTER — Other Ambulatory Visit: Payer: Self-pay

## 2023-05-23 DIAGNOSIS — Z7951 Long term (current) use of inhaled steroids: Secondary | ICD-10-CM | POA: Diagnosis not present

## 2023-05-23 DIAGNOSIS — M545 Low back pain, unspecified: Secondary | ICD-10-CM | POA: Diagnosis not present

## 2023-05-23 DIAGNOSIS — Z760 Encounter for issue of repeat prescription: Secondary | ICD-10-CM | POA: Diagnosis not present

## 2023-05-23 DIAGNOSIS — M25512 Pain in left shoulder: Secondary | ICD-10-CM | POA: Insufficient documentation

## 2023-05-23 DIAGNOSIS — M79652 Pain in left thigh: Secondary | ICD-10-CM | POA: Diagnosis not present

## 2023-05-23 DIAGNOSIS — Y92410 Unspecified street and highway as the place of occurrence of the external cause: Secondary | ICD-10-CM | POA: Insufficient documentation

## 2023-05-23 DIAGNOSIS — J45909 Unspecified asthma, uncomplicated: Secondary | ICD-10-CM | POA: Insufficient documentation

## 2023-05-23 MED ORDER — CYCLOBENZAPRINE HCL 10 MG PO TABS: 10.0000 mg | ORAL_TABLET | Freq: Two times a day (BID) | ORAL | 0 refills | Status: AC | PRN

## 2023-05-23 MED ORDER — CYCLOBENZAPRINE HCL 5 MG PO TABS
5.0000 mg | ORAL_TABLET | Freq: Once | ORAL | Status: AC
  Administered 2023-05-23: 5 mg via ORAL
  Filled 2023-05-23: qty 1

## 2023-05-23 MED ORDER — NAPROXEN 250 MG PO TABS
500.0000 mg | ORAL_TABLET | Freq: Once | ORAL | Status: AC
  Administered 2023-05-23: 500 mg via ORAL
  Filled 2023-05-23: qty 2
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
## 2023-05-23 NOTE — Discharge Instructions (Addendum)
It was a pleasure caring for you today.  I sent the prescription for Flexeril to your pharmacy.  I recommend following up with your primary care provider within the next couple of days.  Seek emergency care if experiencing any new or worsening symptoms.
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
## 2023-09-12 ENCOUNTER — Emergency Department (HOSPITAL_BASED_OUTPATIENT_CLINIC_OR_DEPARTMENT_OTHER): Payer: 59

## 2023-09-12 ENCOUNTER — Encounter (HOSPITAL_BASED_OUTPATIENT_CLINIC_OR_DEPARTMENT_OTHER): Payer: Self-pay

## 2023-09-12 ENCOUNTER — Emergency Department (HOSPITAL_BASED_OUTPATIENT_CLINIC_OR_DEPARTMENT_OTHER)
Admission: EM | Admit: 2023-09-12 | Discharge: 2023-09-12 | Disposition: A | Discharge status: Home / Self Care | Payer: 59 | Attending: Emergency Medicine | Admitting: Emergency Medicine

## 2023-09-12 ENCOUNTER — Other Ambulatory Visit: Payer: Self-pay

## 2023-09-12 DIAGNOSIS — R0602 Shortness of breath: Secondary | ICD-10-CM | POA: Diagnosis not present

## 2023-09-12 DIAGNOSIS — R791 Abnormal coagulation profile: Secondary | ICD-10-CM | POA: Insufficient documentation

## 2023-09-12 DIAGNOSIS — Z7982 Long term (current) use of aspirin: Secondary | ICD-10-CM | POA: Insufficient documentation

## 2023-09-12 DIAGNOSIS — R42 Dizziness and giddiness: Secondary | ICD-10-CM | POA: Diagnosis not present

## 2023-09-12 DIAGNOSIS — Z79899 Other long term (current) drug therapy: Secondary | ICD-10-CM | POA: Diagnosis not present

## 2023-09-12 DIAGNOSIS — J45909 Unspecified asthma, uncomplicated: Secondary | ICD-10-CM | POA: Diagnosis not present

## 2023-09-12 DIAGNOSIS — M25562 Pain in left knee: Secondary | ICD-10-CM | POA: Insufficient documentation

## 2023-09-12 DIAGNOSIS — Z7951 Long term (current) use of inhaled steroids: Secondary | ICD-10-CM | POA: Diagnosis not present

## 2023-09-12 DIAGNOSIS — M79605 Pain in left leg: Secondary | ICD-10-CM | POA: Insufficient documentation

## 2023-09-12 DIAGNOSIS — Z8541 Personal history of malignant neoplasm of cervix uteri: Secondary | ICD-10-CM | POA: Insufficient documentation

## 2023-09-12 LAB — CBC WITH DIFFERENTIAL/PLATELET
Abs Immature Granulocytes: 0.04 10*3/uL (ref 0.00–0.07)
Basophils Absolute: 0.1 10*3/uL (ref 0.0–0.1)
Basophils Relative: 1 %
Eosinophils Absolute: 0.3 10*3/uL (ref 0.0–0.5)
Eosinophils Relative: 4 %
HCT: 34.9 % — ABNORMAL LOW (ref 36.0–46.0)
Hemoglobin: 12.1 g/dL (ref 12.0–15.0)
Immature Granulocytes: 1 %
Lymphocytes Relative: 32 %
Lymphs Abs: 2.6 10*3/uL (ref 0.7–4.0)
MCH: 29.5 pg (ref 26.0–34.0)
MCHC: 34.7 g/dL (ref 30.0–36.0)
MCV: 85.1 fL (ref 80.0–100.0)
Monocytes Absolute: 0.6 10*3/uL (ref 0.1–1.0)
Monocytes Relative: 8 %
Neutro Abs: 4.4 10*3/uL (ref 1.7–7.7)
Neutrophils Relative %: 54 %
Platelets: 252 10*3/uL (ref 150–400)
RBC: 4.1 MIL/uL (ref 3.87–5.11)
RDW: 13.2 % (ref 11.5–15.5)
WBC: 8.1 10*3/uL (ref 4.0–10.5)
nRBC: 0 % (ref 0.0–0.2)

## 2023-09-12 LAB — I-STAT CHEM 8, ED
BUN: 15 mg/dL (ref 6–20)
Calcium, Ion: 1.2 mmol/L (ref 1.15–1.40)
Chloride: 103 mmol/L (ref 98–111)
Creatinine, Ser: 0.7 mg/dL (ref 0.44–1.00)
Glucose, Bld: 81 mg/dL (ref 70–99)
HCT: 36 % (ref 36.0–46.0)
Hemoglobin: 12.2 g/dL (ref 12.0–15.0)
Potassium: 3.8 mmol/L (ref 3.5–5.1)
Sodium: 139 mmol/L (ref 135–145)
TCO2: 25 mmol/L (ref 22–32)

## 2023-09-12 LAB — TROPONIN I (HIGH SENSITIVITY): Troponin I (High Sensitivity): <2 ng/L (ref <18)

## 2023-09-12 LAB — D-DIMER, QUANTITATIVE: D-Dimer, Quant: 0.76 ug{FEU}/mL — ABNORMAL HIGH (ref 0.00–0.50)

## 2023-09-12 LAB — HCG, QUANTITATIVE, PREGNANCY: hCG, Beta Chain, Quant, S: <1 m[IU]/mL (ref <5)

## 2023-09-12 MED ORDER — IOHEXOL 300 MG/ML  SOLN: 75.0000 mL | Freq: Once | INTRAMUSCULAR | Status: DC | PRN

## 2023-09-12 MED ORDER — IOHEXOL 350 MG/ML SOLN
75.0000 mL | Freq: Once | INTRAVENOUS | Status: AC | PRN
  Administered 2023-09-12: 75 mL via INTRAVENOUS

## 2023-09-12 NOTE — ED Provider Notes (Signed)
[place] [facility] Provider Note   CSN: [id number] Arrival date & time: 09/12/23  1512     History  Chief Complaint  Patient presents with   Leg Pain   Shortness of Breath    Alyssa Owen is a 37 y.o. female with PMH as listed below who presents with left leg pain and SOB for the last couple of days. Pt recently had left ACL repair and before that left hip "reconstruction". Has been in L knee immobilizer. Pt endorses increased pain behind left knee and into calf. Pt also reports recent exertional SOB. Pt denies CP or palpitations. Pt endorses dizziness. Pt is not on aspirin or blood thinners. No h/o DVT/PE, no hormones, no recent travel. No LEE N/T, new trauma. Noticed that her LLE has been a little more hot as well. Saw surgeon yesterday who saw the wound and was pleased with healing. No purulent drainage, f/c. Worked with PT today who noticed the swelling and suggested she come to ED.   Past Medical History:  Diagnosis Date   Asthma    Cervical cancer (HCC)    IBS (irritable bowel syndrome)    Migraine        Home Medications Prior to Admission medications   Medication Sig Start Date End Date Taking? Authorizing Provider  albuterol (PROVENTIL HFA;VENTOLIN HFA) 108 (90 Base) MCG/ACT inhaler Inhale 1-2 puffs into the lungs every 6 (six) hours as needed for wheezing or shortness of breath.    [provider]  aspirin-acetaminophen-caffeine (EXCEDRIN MIGRAINE) [phone] MG tablet Take 2-3 tablets by mouth every 6 (six) hours as needed for headache.     [provider]  budesonide-formoterol (SYMBICORT) 160-4.5 MCG/ACT inhaler Inhale 2 puffs into the lungs 2 (two) times daily. 04/17/18   [provider]  diclofenac Sodium (VOLTAREN) 1 % GEL Apply 2 g topically 4 (four) times daily as needed. 09/25/22   Long, Arlyss Repress, MD  Erenumab-aooe (AIMOVIG) 70 MG/ML SOAJ Inject 70 mg into the skin every 30 (thirty) days.     [provider]  meloxicam (MOBIC) 7.5 MG tablet Take 1 tablet (7.5 mg total) by mouth daily. 07/30/20   Kirsteins, Victorino Sparrow, MD  pantoprazole (PROTONIX) 20 MG tablet Take 1 tablet (20 mg total) by mouth daily. 01/23/23   Al Decant, PA-C  rizatriptan (MAXALT-MLT) 10 MG disintegrating tablet Take 1 tablet (10 mg total) by mouth as needed for migraine. May repeat in 2 hours if needed 02/04/15   Nilda Riggs, NP      Allergies    Bee venom, Other, Tomato, Cephalexin, Codeine, Gluten meal, Lactose intolerance (gi), Sulfa antibiotics, Cephalosporins, Oxycodone, and Prednisone    Review of Systems   Review of Systems A 10 point review of systems was performed and is negative unless otherwise reported in HPI.  Physical Exam Updated Vital Signs BP 123/71   Pulse 85   Temp 97.6 F (36.4 C) (Oral)   Resp 16   Wt 102.1 kg   LMP 11/12/2014   SpO2 100%   BMI 33.23 kg/m  Physical Exam General: Normal appearing female, lying in bed.  HEENT: Sclera anicteric, MMM, trachea midline.  Cardiology: RRR, no murmurs/rubs/gallops. Resp: Normal respiratory rate and effort. CTAB, no wheezes, rhonchi, crackles.  Abd: Soft, non-tender, non-distended. No rebound tenderness or guarding.  GU: Deferred. MSK: Vertical incision over L knee with good healing, no induration/fluctuance/purulent drainage. Entire LLE is mildly warm compared to R one and with diffuse  mild nonpitting edema. Intact DP/PT pulses and NVI in BL LEs. FROM of knee limited d/t recent surgery. Mild TTP behind knee. No significant joint effusion noted. No peripheral edema or signs of trauma. Extremities without deformity or TTP. No cyanosis or clubbing. Skin: warm, dry.  Neuro: A&Ox4, CNs II-XII grossly intact. MAEs. Sensation grossly intact.  Psych: Normal mood and affect.   ED Results / Procedures / Treatments   Labs (all labs ordered are listed, but only abnormal results are displayed) Labs Reviewed  CBC WITH  DIFFERENTIAL/PLATELET - Abnormal; Notable for the following components:      Result Value   HCT 34.9 (*)    All other components within normal limits  D-DIMER, QUANTITATIVE - Abnormal; Notable for the following components:   D-Dimer, Quant 0.76 (*)    All other components within normal limits  HCG, QUANTITATIVE, PREGNANCY  I-STAT CHEM 8, ED  TROPONIN I (HIGH SENSITIVITY)  TROPONIN I (HIGH SENSITIVITY)    EKG EKG Interpretation Date/Time:  Tuesday September 12 2023 15:29:50 EST Ventricular Rate:  78 PR Interval:  161 QRS Duration:  99 QT Interval:  348 QTC Calculation: 397 R Axis:   51  Text Interpretation: Sinus rhythm Consider left atrial enlargement Abnormal R-wave progression, early transition Confirmed by Vivi Barrack [phone]) on 09/12/2023 4:37:11 PM  Radiology US Venous Img Lower  Left (DVT Study)  Result Date: 09/12/2023 CLINICAL DATA:  Left popliteal fossa pain. EXAM: LEFT LOWER EXTREMITY VENOUS DOPPLER ULTRASOUND TECHNIQUE: Gray-scale sonography with graded compression, as well as color Doppler and duplex ultrasound were performed to evaluate the lower extremity deep venous systems from the level of the common femoral vein and including the common femoral, femoral, profunda femoral, popliteal and calf veins including the posterior tibial, peroneal and gastrocnemius veins when visible. The superficial great saphenous vein was also interrogated. Spectral Doppler was utilized to evaluate flow at rest and with distal augmentation maneuvers in the common femoral, femoral and popliteal veins. COMPARISON:  None Available. FINDINGS: Contralateral Common Femoral Vein: Respiratory phasicity is normal and symmetric with the symptomatic side. No evidence of thrombus. Normal compressibility. Common Femoral Vein: No evidence of thrombus. Normal compressibility, respiratory phasicity and response to augmentation. Saphenofemoral Junction: No evidence of thrombus. Normal compressibility and flow on  color Doppler imaging. Profunda Femoral Vein: No evidence of thrombus. Normal compressibility and flow on color Doppler imaging. Femoral Vein: No evidence of thrombus. Normal compressibility, respiratory phasicity and response to augmentation. Popliteal Vein: No evidence of thrombus. Normal compressibility, respiratory phasicity and response to augmentation. Calf Veins: No evidence of thrombus. Normal compressibility and flow on color Doppler imaging. Superficial Great Saphenous Vein: No evidence of thrombus. Normal compressibility. Venous Reflux:  None. Other Findings: No evidence of superficial thrombophlebitis or abnormal fluid collection. IMPRESSION: No evidence of left lower extremity deep venous thrombosis. Electronically Signed   By: Irish Lack M.D.   On: 09/12/2023 17:11   CT Angio Chest PE W and/or Wo Contrast  Result Date: 09/12/2023 CLINICAL DATA:  Pulmonary embolism (PE) suspected, high prob EXAM: CT ANGIOGRAPHY CHEST WITH CONTRAST TECHNIQUE: Multidetector CT imaging of the chest was performed using the standard protocol during bolus administration of intravenous contrast. Multiplanar CT image reconstructions and MIPs were obtained to evaluate the vascular anatomy. RADIATION DOSE REDUCTION: This exam was performed according to the departmental dose-optimization program which includes automated exposure control, adjustment of the mA and/or kV according to patient size and/or use of iterative reconstruction technique. CONTRAST:  75mL OMNIPAQUE IOHEXOL 350 MG/ML  SOLN COMPARISON:  CT chest dated November 13, 2014. FINDINGS: Cardiovascular: Satisfactory opacification of the pulmonary arteries to the segmental level. No evidence of pulmonary embolism. Normal heart size. No pericardial effusion. Mediastinum/Nodes: No enlarged mediastinal, hilar, or axillary lymph nodes. Calcified mediastinal and left hilar granulomatous nodes. Thyroid gland, trachea, and esophagus demonstrate no significant findings.  Lungs/Pleura: Lungs are clear. No pleural effusion or pneumothorax. No suspicious pulmonary nodule. Upper Abdomen: No acute abnormality. Musculoskeletal: No chest wall abnormality. No acute or significant osseous findings. Review of the MIP images confirms the above findings. IMPRESSION: 1. No evidence of pulmonary embolism. 2. No acute intrathoracic findings. Electronically Signed   By: Hart Robinsons M.D.   On: 09/12/2023 16:55   DG Chest 2 View  Result Date: 09/12/2023 CLINICAL DATA:  Shortness of breath. EXAM: CHEST - 2 VIEW COMPARISON:  Chest radiograph dated January 21, 2023. FINDINGS: The heart size and mediastinal contours are within normal limits. Both lungs are clear. No pleural effusion or pneumothorax. The visualized skeletal structures are unremarkable. IMPRESSION: No acute cardiopulmonary findings. Electronically Signed   By: Hart Robinsons M.D.   On: 09/12/2023 16:43    Procedures Procedures    Medications Ordered in ED Medications  iohexol (OMNIPAQUE) 350 MG/ML injection 75 mL (75 mLs Intravenous Contrast Given 09/12/23 1627)    ED Course/ Medical Decision Making/ A&P                          Medical Decision Making Amount and/or Complexity of Data Reviewed Labs: ordered. Decision-making details documented in ED Course. Radiology: ordered. Decision-making details documented in ED Course.  Risk Prescription drug management.    This patient presents to the ED for concern of LLE swelling/pain, this involves an extensive number of treatment options, and is a complaint that carries with it a high risk of complications and morbidity.  I considered the following differential and admission for this acute, potentially life threatening condition.   MDM:    Consider DVT +/- PE at top of differential. Risk factors of surgery/immobilization. Will get dimer, DVT US, and CT PE. Consider normal surgical site pain/healing/swelling. Lower c/f surgical site infection vs cellulitis  though still on differential w/ increased warmth to the area. No purulent drainage or induration/fluctuance noticed at surgical site. Also consider stationary lymphedema or lower extremity vascular insufficiency. Consider CHF though no history, no symmetric BL LEE, no crackles on exam or orthopnea. No CP to indicate ACS, and EKG w/o signs of arrhythmia or ischemia. Consider anemia, electrolyte deranagements as well.   Clinical Course as of 09/12/23 1737  Tue Sep 12, 2023  1636 D-Dimer, Quant(!): 0.76 +elevated dimer [HN]  1636 Istat, CBC unremarkable [HN]  1637 Troponin I (High Sensitivity): <2 [HN]  1651 HCG, Beta Chain, Quant, S: <1 neg [HN]  1652 DG Chest 2 View No acute cardiopulmonary findings. [HN]  1725 US Venous Img Lower  Left (DVT Study) Other Findings: No evidence of superficial thrombophlebitis or abnormal fluid collection.  IMPRESSION: No evidence of left lower extremity deep venous thrombosis.   [HN]  1726 CT Angio Chest PE W and/or Wo Contrast 1. No evidence of pulmonary embolism. 2. No acute intrathoracic findings.   [HN]    Clinical Course User Index [HN] Loetta Rough, MD    Labs: I Ordered, and personally interpreted labs.  The pertinent results include:  those listed above  Imaging Studies ordered: I ordered imaging studies including CT PE, DVT US.  CXR ordered from triage I independently visualized and interpreted imaging. I agree with the radiologist interpretation  Additional history obtained from chart review, husband at bedside.   Cardiac Monitoring: The patient was maintained on a cardiac monitor.  I personally viewed and interpreted the cardiac monitored which showed an underlying rhythm of: NSR  Reevaluation: After the interventions noted above, I reevaluated the patient and found that they have :stayed the same  Social Determinants of Health: Lives independently  Disposition:  Patient with reassuring lab and imaging. Discussed with her to  watch for worsening sxs and/or fever, purulent drainage. Patient reports understanding and will follow up within 1-2 weeks. DC w/ discharge instructions/return precautions. All questions answered to patient's satisfaction.    Co morbidities that complicate the patient evaluation  Past Medical History:  Diagnosis Date   Asthma    Cervical cancer (HCC)    IBS (irritable bowel syndrome)    Migraine      Medicines Meds ordered this encounter  Medications   DISCONTD: iohexol (OMNIPAQUE) 300 MG/ML solution 75 mL   iohexol (OMNIPAQUE) 350 MG/ML injection 75 mL    I have reviewed the patients home medicines and have made adjustments as needed  Problem List / ED Course: Problem List Items Addressed This Visit   None Visit Diagnoses     Left leg pain    -  Primary                   This note was created using dictation software, which may contain spelling or grammatical errors.    Loetta Rough, MD 09/12/23 [id number]

## 2023-09-12 NOTE — ED Notes (Signed)
Pt. Had L knee surgery a month ago.  Pt. Now reports she is having shortness of breath with movement and reports pain behind the left knee.  Pt. L knee trap to keep the L leg straight.

## 2023-09-12 NOTE — ED Notes (Signed)
Discharge instructions reviewed with patient. Patient verbalizes understanding, no further questions at this time. Medications/prescriptions and follow up information provided. No acute distress noted at time of departure.   New dressing applied to knee prior to departure

## 2023-09-12 NOTE — ED Triage Notes (Signed)
Pt arrives with left leg pain and SOB. Pt recently had left knee surgery. Pt endorses increased pain behind left knee and into calf. Pt reports exertional SOB. Pt denies CP or palpitations. Pt endorses dizziness. Pt is not on aspirin or blood thinners.

## 2023-09-12 NOTE — Discharge Instructions (Signed)
Thank you for coming to [facility] Emergency Department. You were seen for left knee pain after surgery. We did an exam, labs, and imaging, and these showed no acute findings. Your leg also does not appear to be infected. You can alternate taking Tylenol and ibuprofen as needed for pain. You can take 650mg  tylenol (acetaminophen) every 4-6 hours, and 600 mg ibuprofen 3 times a day.  Please follow up with your primary care provider within 1 week.   Do not hesitate to return to the ED or call 911 if you experience: -Worsening symptoms -Increased pain, redness, swelling -Numbness/tingling -Lightheadedness, passing out -Fevers/chills -Anything else that concerns you
